# Patient Record
Sex: Male | Born: 1970 | Race: White | Hispanic: No | Marital: Married | Smoking: Never smoker
Health system: Southern US, Community
[De-identification: ages and names within clinical notes are randomized; demographics above are authoritative.]

## PROBLEM LIST (undated history)

## (undated) DIAGNOSIS — E119 Type 2 diabetes mellitus without complications: Secondary | ICD-10-CM

## (undated) DIAGNOSIS — I10 Essential (primary) hypertension: Secondary | ICD-10-CM

## (undated) DIAGNOSIS — J45909 Unspecified asthma, uncomplicated: Secondary | ICD-10-CM

## (undated) DIAGNOSIS — M109 Gout, unspecified: Secondary | ICD-10-CM

## (undated) DIAGNOSIS — E66813 Obesity, class 3: Secondary | ICD-10-CM

## (undated) DIAGNOSIS — M1990 Unspecified osteoarthritis, unspecified site: Secondary | ICD-10-CM

## (undated) DIAGNOSIS — K219 Gastro-esophageal reflux disease without esophagitis: Secondary | ICD-10-CM

## (undated) DIAGNOSIS — E039 Hypothyroidism, unspecified: Secondary | ICD-10-CM

## (undated) DIAGNOSIS — G4730 Sleep apnea, unspecified: Secondary | ICD-10-CM

## (undated) DIAGNOSIS — E781 Pure hyperglyceridemia: Secondary | ICD-10-CM

## (undated) DIAGNOSIS — T753XXA Motion sickness, initial encounter: Secondary | ICD-10-CM

## (undated) HISTORY — PX: SINUS EXPLORATION: SHX5214

---

## 2000-03-17 HISTORY — PX: FRACTURE SURGERY: SHX138

## 2004-10-26 ENCOUNTER — Emergency Department: Payer: Self-pay | Admitting: Unknown Physician Specialty

## 2004-11-02 ENCOUNTER — Other Ambulatory Visit: Payer: Self-pay

## 2004-11-02 ENCOUNTER — Emergency Department: Payer: Self-pay | Admitting: Emergency Medicine

## 2005-02-07 ENCOUNTER — Emergency Department: Payer: Self-pay | Admitting: Emergency Medicine

## 2005-02-13 ENCOUNTER — Emergency Department: Payer: Self-pay | Admitting: Unknown Physician Specialty

## 2006-07-07 ENCOUNTER — Ambulatory Visit: Payer: Self-pay

## 2006-12-10 ENCOUNTER — Ambulatory Visit: Payer: Self-pay | Admitting: Internal Medicine

## 2009-11-22 ENCOUNTER — Ambulatory Visit: Payer: Self-pay | Admitting: General Practice

## 2010-03-17 HISTORY — PX: UMBILICAL HERNIA REPAIR: SHX196

## 2010-08-07 ENCOUNTER — Ambulatory Visit: Payer: Self-pay | Admitting: Surgery

## 2011-07-18 ENCOUNTER — Ambulatory Visit: Payer: Self-pay | Admitting: Internal Medicine

## 2011-08-29 ENCOUNTER — Ambulatory Visit: Payer: Self-pay | Admitting: General Practice

## 2011-08-29 LAB — CBC WITH DIFFERENTIAL/PLATELET
Basophil %: 0.5 %
HCT: 44 % (ref 40.0–52.0)
HGB: 14.8 g/dL (ref 13.0–18.0)
Lymphocyte #: 3 10*3/uL (ref 1.0–3.6)
Lymphocyte %: 31.1 %
MCH: 27.8 pg (ref 26.0–34.0)
Monocyte #: 0.7 x10 3/mm (ref 0.2–1.0)
Monocyte %: 7.4 %
Neutrophil %: 57.1 %
Platelet: 309 10*3/uL (ref 150–440)
RBC: 5.31 10*6/uL (ref 4.40–5.90)
WBC: 9.7 10*3/uL (ref 3.8–10.6)

## 2011-08-29 LAB — COMPREHENSIVE METABOLIC PANEL
Albumin: 3.8 g/dL (ref 3.4–5.0)
Alkaline Phosphatase: 116 U/L (ref 50–136)
Anion Gap: 7 (ref 7–16)
BUN: 16 mg/dL (ref 7–18)
Bilirubin,Total: 0.6 mg/dL (ref 0.2–1.0)
Calcium, Total: 9.2 mg/dL (ref 8.5–10.1)
EGFR (African American): >60
Glucose: 94 mg/dL (ref 65–99)
Osmolality: 280 (ref 275–301)
Potassium: 4.1 mmol/L (ref 3.5–5.1)
SGOT(AST): 42 U/L — ABNORMAL HIGH (ref 15–37)
SGPT (ALT): 63 U/L
Total Protein: 7.6 g/dL (ref 6.4–8.2)

## 2011-08-29 LAB — LIPASE, BLOOD: Lipase: 136 U/L (ref 73–393)

## 2012-11-26 ENCOUNTER — Ambulatory Visit: Payer: Self-pay | Admitting: General Practice

## 2013-11-18 DIAGNOSIS — E039 Hypothyroidism, unspecified: Secondary | ICD-10-CM | POA: Insufficient documentation

## 2013-11-18 DIAGNOSIS — E781 Pure hyperglyceridemia: Secondary | ICD-10-CM | POA: Insufficient documentation

## 2013-11-18 DIAGNOSIS — R7989 Other specified abnormal findings of blood chemistry: Secondary | ICD-10-CM | POA: Insufficient documentation

## 2013-11-22 DIAGNOSIS — M109 Gout, unspecified: Secondary | ICD-10-CM | POA: Insufficient documentation

## 2014-03-07 ENCOUNTER — Ambulatory Visit: Payer: Self-pay | Admitting: Unknown Physician Specialty

## 2014-03-12 LAB — WOUND CULTURE

## 2014-03-14 LAB — WOUND CULTURE

## 2014-03-28 LAB — CULTURE, FUNGUS WITHOUT SMEAR

## 2014-05-19 ENCOUNTER — Ambulatory Visit: Payer: Self-pay | Admitting: Neurology

## 2014-05-19 ENCOUNTER — Ambulatory Visit: Payer: Self-pay | Admitting: Unknown Physician Specialty

## 2014-07-10 LAB — SURGICAL PATHOLOGY

## 2014-07-12 NOTE — Op Note (Signed)
PATIENT NAME:  Robert Skinner, Robert Skinner MR#:  [id number] DATE OF BIRTH:  March 23, 1970  DATE OF PROCEDURE:  03/07/2014   PREOPERATIVE DIAGNOSIS: Chronic right maxillary sinusitis.   POSTOPERATIVE DIAGNOSIS: Chronic right maxillary sinusitis.     SURGEON:  Roena Malady, MD  PROCEDURES PERFORMED:  1.  Stryker navigation system.  2.  Right endoscopic maxillary antrostomy with removal of tissue.    OPERATIVE FINDINGS: Thick sclerotic bone surrounding the sinus, pus emanating from the ostiomeatal unit, thickened hard crust within the sinus and polypoid mucosa.   DESCRIPTION OF PROCEDURE: [name] was identified in the room; he was taken to the Operating Room and placed in the supine position. After general endotracheal anesthesia, the table was turned 90 degrees.  Stryker navigation unit was applied and calibrated and used throughout the procedure. The patient was then draped in the usual fashion for endoscopic sinus surgery. A cottonoid pledget with phenylephrine lidocaine were placed at the right nostril.  At approximately 5 minutes these were removed. A local anesthetic of 1% lidocaine 1:100,000 epinephrine was used to inject the lateral nasal wall and the middle turbinate.  A 0 degree endoscope was introduced into the nose. There was significant lateralization of the middle turbinate. Multiple attempts were tried using the Cottle elevator pushed this medially, but were unsuccessful.  Therefore, submucous resection was performed on the anterior tip of the middle turbinate using the cutting forceps and the straight ethmoid forceps.  This gave excellent visualization to the ostiomeatal area. Using the Stryker navigator, the uncinate process was identified. Cottle elevator was used to take this down. There was pus emanating from the sinus which was sent for a suction trap culture as the uncinate process was taken down, the straight and side-biting forceps were used to open the antrostomy widely. A curved suction was  placed into the sinus.  There was thick, hard, crusty mucopurulent debris which was suctioned out.  This again was sent for culture on a regular culture swab.  Using a side-biting forceps, the maxillary sinus was opened widely and suctioned free of all debris. There was thickened mucosal layer within the sinus which was removed using straight and side-biting forceps. With this completed, the suction cautery was then used to cauterize the anterior tip of the middle turbinate to prevent bleeding.  Stammberger gel was then used to fill the maxillary sinus and the ostiomeatal region. The patient was then returned to anesthesia where he was awakened in the operating room and taken to recovery room in stable condition.   CULTURES: Right maxillary sinus.   SPECIMEN: Right maxillary sinus   ESTIMATED BLOOD LOSS: Less than 20 mL.      ____________________________ Roena Malady, MD ctm:DT D: 03/07/2014 08:38:00 ET T: 03/07/2014 11:58:47 ET JOB#: [id number]  cc: Roena Malady, MD, <Dictator> Roena Malady MD ELECTRONICALLY SIGNED 03/31/2014 8:00

## 2014-12-13 ENCOUNTER — Ambulatory Visit: Payer: PRIVATE HEALTH INSURANCE | Attending: Neurology

## 2014-12-13 DIAGNOSIS — G4733 Obstructive sleep apnea (adult) (pediatric): Secondary | ICD-10-CM | POA: Insufficient documentation

## 2014-12-15 ENCOUNTER — Other Ambulatory Visit: Payer: Self-pay

## 2014-12-15 ENCOUNTER — Emergency Department: Payer: PRIVATE HEALTH INSURANCE

## 2014-12-15 ENCOUNTER — Emergency Department
Admission: EM | Admit: 2014-12-15 | Discharge: 2014-12-15 | Disposition: A | Discharge status: Home / Self Care | Payer: PRIVATE HEALTH INSURANCE | Attending: Emergency Medicine | Admitting: Emergency Medicine

## 2014-12-15 ENCOUNTER — Encounter: Payer: Self-pay | Admitting: Emergency Medicine

## 2014-12-15 DIAGNOSIS — R079 Chest pain, unspecified: Secondary | ICD-10-CM | POA: Insufficient documentation

## 2014-12-15 DIAGNOSIS — R200 Anesthesia of skin: Secondary | ICD-10-CM | POA: Diagnosis not present

## 2014-12-15 DIAGNOSIS — R0602 Shortness of breath: Secondary | ICD-10-CM | POA: Diagnosis present

## 2014-12-15 DIAGNOSIS — Z880 Allergy status to penicillin: Secondary | ICD-10-CM | POA: Diagnosis not present

## 2014-12-15 LAB — BASIC METABOLIC PANEL
ANION GAP: 7 (ref 5–15)
BUN: 11 mg/dL (ref 6–20)
CHLORIDE: 106 mmol/L (ref 101–111)
CO2: 25 mmol/L (ref 22–32)
CREATININE: 1.27 mg/dL — AB (ref 0.61–1.24)
Calcium: 10 mg/dL (ref 8.9–10.3)
GFR calc non Af Amer: >60 mL/min (ref >60)
Glucose, Bld: 115 mg/dL — ABNORMAL HIGH (ref 65–99)
Potassium: 3.8 mmol/L (ref 3.5–5.1)
Sodium: 138 mmol/L (ref 135–145)

## 2014-12-15 LAB — CBC
HEMATOCRIT: 52.8 % — AB (ref 40.0–52.0)
Hemoglobin: 17.8 g/dL (ref 13.0–18.0)
MCH: 27.4 pg (ref 26.0–34.0)
MCHC: 33.7 g/dL (ref 32.0–36.0)
MCV: 81.3 fL (ref 80.0–100.0)
Platelets: 370 10*3/uL (ref 150–440)
RBC: 6.49 MIL/uL — AB (ref 4.40–5.90)
RDW: 14.6 % — ABNORMAL HIGH (ref 11.5–14.5)
WBC: 13.5 10*3/uL — AB (ref 3.8–10.6)

## 2014-12-15 LAB — FIBRIN DERIVATIVES D-DIMER (ARMC ONLY): Fibrin derivatives D-dimer (ARMC): 371 (ref 0–499)

## 2014-12-15 LAB — TROPONIN I: Troponin I: <0.03 ng/mL (ref <0.031)

## 2014-12-15 NOTE — ED Notes (Signed)
Sob becomes worse with exertion

## 2014-12-15 NOTE — Discharge Instructions (Signed)

## 2014-12-15 NOTE — ED Notes (Signed)

## 2014-12-15 NOTE — ED Notes (Signed)
Brought from [facility] with 2 week hx of SOB and chest pressure. Also has had intermittent leg numbness and headaches

## 2014-12-15 NOTE — ED Provider Notes (Signed)
[facility] Emergency Department Provider Note  ____________________________________________  Time seen: Approximately 615 PM  I have reviewed the triage vital signs and the nursing notes.   HISTORY  Chief Complaint Shortness of Breath    HPI Robert Skinner is a 44 y.o. male with a history of hypertension who is presenting today with 2 weeks of shortness of breath and chest pressure. He says that it feels like his gastric reflux. However, he is already on pantoprazole. He says that he has a fullness to the central lower portion of his chest which worsens in the evening when he is going to lie back to go to sleep. He says that he also has diaphoresis at this time. Associated with difficulty breathing. He denies any pain at this time. However, does say that he feels this "fullness" when he takes a deep breath. The patient does take testosterone supplementation. He is currently under the care of multiple doctors including a neurologist who he is seeing for intermittent lower extremity numbness. Also being evaluated for sleep apnea at this time.   History reviewed. No pertinent past medical history.  There are no active problems to display for this patient.   History reviewed. No pertinent past surgical history.  No current outpatient prescriptions on file.  Allergies Penicillins  No family history on file.  Social History Social History  Substance Use Topics  . Smoking status: Never Smoker   . Smokeless tobacco: None  . Alcohol Use: No    Review of Systems Constitutional: No fever/chills Eyes: No visual changes. ENT: No sore throat. Cardiovascular: As above  Respiratory: As above  Gastrointestinal: No abdominal pain.  No nausea, no vomiting.  No diarrhea.  No constipation. Genitourinary: Negative for dysuria. Musculoskeletal: Negative for back pain. Skin: Negative for rash. Neurological: Negative for headaches, focal weakness or  numbness.  10-point ROS otherwise negative.  ____________________________________________   PHYSICAL EXAM:  VITAL SIGNS: ED Triage Vitals  Enc Vitals Group     BP 12/15/14 1611 144/94 mmHg     Pulse Rate 12/15/14 1611 101     Resp 12/15/14 1611 24     Temp 12/15/14 1611 97.9 F (36.6 C)     Temp Source 12/15/14 1611 Oral     SpO2 12/15/14 1611 95 %     Weight 12/15/14 1611 300 lb (136.079 kg)     Height 12/15/14 1611 6\' 1"  (1.854 m)     Head Cir --      Peak Flow --      Pain Score --      Pain Loc --      Pain Edu? --      Excl. in [place]? --     Constitutional: Alert and oriented. Well appearing and in no acute distress. Eyes: Conjunctivae are normal. PERRL. EOMI. Head: Atraumatic. Nose: No congestion/rhinnorhea. Mouth/Throat: Mucous membranes are moist.  Oropharynx non-erythematous. Neck: No stridor.   Cardiovascular: Normal rate, regular rhythm. Grossly normal heart sounds.  Good peripheral circulation. Pain not reproducible on palpation over the chest. Respiratory: Normal respiratory effort.  No retractions. Lungs CTAB. Gastrointestinal: Soft with mild left upper quadrant tenderness to palpation. No right upper quadrant tenderness palpation. Negative Murphy sign.. No distention. No abdominal bruits. No CVA tenderness. Musculoskeletal: No lower extremity tenderness nor edema.  No joint effusions. Neurologic:  Normal speech and language. No gross focal neurologic deficits are appreciated. No gait instability. Skin:  Skin is warm, dry and intact. No rash noted. Psychiatric: Mood  and affect are normal. Speech and behavior are normal.  ____________________________________________   LABS (all labs ordered are listed, but only abnormal results are displayed)  Labs Reviewed  BASIC METABOLIC PANEL - Abnormal; Notable for the following:    Glucose, Bld 115 (*)    Creatinine, Ser 1.27 (*)    All other components within normal limits  CBC - Abnormal; Notable for the  following:    WBC 13.5 (*)    RBC 6.49 (*)    HCT 52.8 (*)    RDW 14.6 (*)    All other components within normal limits  TROPONIN I  FIBRIN DERIVATIVES D-DIMER ([facility] ONLY)   ____________________________________________  EKG  ED ECG REPORT I, Doran Stabler, the attending physician, personally viewed and interpreted this ECG.   Date: 12/15/2014  EKG Time: 1613  Rate: 98  Rhythm: normal sinus rhythm  Axis: Normal axis  Intervals:Incomplete right bundle-branch block  ST&T Change: No ST segment elevation or depression. No abnormal T-wave inversion.  ____________________________________________  RADIOLOGY  No acute cardiopulmonary process. I personally reviewed these films. ____________________________________________   PROCEDURES    ____________________________________________   INITIAL IMPRESSION / ASSESSMENT AND PLAN / ED COURSE  Pertinent labs & imaging results that were available during my care of the patient were reviewed by me and considered in my medical decision making (see chart for details).  ----------------------------------------- 7:27 PM on 12/15/2014 -----------------------------------------  Patient with normal d-dimer. 2 weeks of chest pain without significant ischemia findings on EKG and negative troponin. Unclear cause of the chest pain. However, reassuring cardiac as well as pulmonary embolus workup. Discussed case with Dr. Sophronia Simas who will see the patient is a follow-up in the office. I for the information for this patient via epic inbox to Dr. Sophronia Simas who will follow-up for scheduling with the patient. I discussed the lab results as well as the imaging results with the patient and his family. They're aware that he has had reassuring results here in the emergency department. We also discussed trying Maalox at home in the evenings and he developed these symptoms when laying back. He says that yesterday he had fewer symptoms are normal because he had a  sleep study 2 nights ago where he wore CPAP. He believes that some of the symptoms may be attributed to untreated sleep apnea which I agree with. However, this doesn't completely explain the chest pain. It may explain some shortness of breath though. ____________________________________________   FINAL CLINICAL IMPRESSION(S) / ED DIAGNOSES  Acute chest pain. Initial visit.    Orbie Pyo, MD 12/15/14 [phone]

## 2014-12-22 ENCOUNTER — Ambulatory Visit: Payer: PRIVATE HEALTH INSURANCE | Admitting: Physician Assistant

## 2014-12-22 ENCOUNTER — Ambulatory Visit: Payer: Self-pay

## 2014-12-22 DIAGNOSIS — R7989 Other specified abnormal findings of blood chemistry: Secondary | ICD-10-CM

## 2014-12-22 MED ORDER — TESTOSTERONE CYPIONATE 200 MG/ML IM SOLN: 200.0000 mg | INTRAMUSCULAR | Status: DC

## 2014-12-22 MED ORDER — TESTOSTERONE CYPIONATE 200 MG/ML IM SOLN
200.0000 mg | Freq: Once | INTRAMUSCULAR | Status: AC
  Administered 2014-12-22: 200 mg via INTRAMUSCULAR

## 2014-12-22 NOTE — Progress Notes (Signed)
Pt here for testosterone injection

## 2015-01-05 ENCOUNTER — Other Ambulatory Visit: Payer: Self-pay | Admitting: Family Medicine

## 2015-01-05 ENCOUNTER — Ambulatory Visit: Payer: Self-pay

## 2015-01-05 DIAGNOSIS — R7989 Other specified abnormal findings of blood chemistry: Secondary | ICD-10-CM

## 2015-01-05 MED ORDER — TESTOSTERONE CYPIONATE 200 MG/ML IM SOLN
200.0000 mg | Freq: Once | INTRAMUSCULAR | Status: AC
  Administered 2015-01-05: 200 mg via INTRAMUSCULAR

## 2015-01-09 LAB — CBC WITH DIFFERENTIAL/PLATELET
Basophils Absolute: 0 10*3/uL (ref 0.0–0.2)
Basos: 0 %
EOS (ABSOLUTE): 0.2 10*3/uL (ref 0.0–0.4)
EOS: 3 %
HEMATOCRIT: 48.1 % (ref 37.5–51.0)
HEMOGLOBIN: 16.5 g/dL (ref 12.6–17.7)
IMMATURE GRANS (ABS): 0 10*3/uL (ref 0.0–0.1)
Immature Granulocytes: 0 %
LYMPHS ABS: 2.7 10*3/uL (ref 0.7–3.1)
Lymphs: 31 %
MCH: 28.4 pg (ref 26.6–33.0)
MCHC: 34.3 g/dL (ref 31.5–35.7)
MCV: 83 fL (ref 79–97)
MONOCYTES: 9 %
Monocytes Absolute: 0.8 10*3/uL (ref 0.1–0.9)
NEUTROS ABS: 5 10*3/uL (ref 1.4–7.0)
Neutrophils: 57 %
Platelets: 290 10*3/uL (ref 150–379)
RBC: 5.8 x10E6/uL (ref 4.14–5.80)
RDW: 14.6 % (ref 12.3–15.4)
WBC: 8.7 10*3/uL (ref 3.4–10.8)

## 2015-01-09 LAB — TESTOSTERONE: Testosterone: 291 ng/dL — ABNORMAL LOW (ref 348–1197)

## 2015-01-09 LAB — B12 AND FOLATE PANEL
FOLATE: 4.7 ng/mL (ref >3.0)
Vitamin B-12: 316 pg/mL (ref 211–946)

## 2015-01-09 LAB — SPECIMEN STATUS REPORT

## 2015-01-09 LAB — PSA: Prostate Specific Ag, Serum: 1.2 ng/mL (ref 0.0–4.0)

## 2015-01-17 ENCOUNTER — Other Ambulatory Visit: Payer: Self-pay | Admitting: Emergency Medicine

## 2015-01-17 MED ORDER — LOSARTAN POTASSIUM 50 MG PO TABS: 50.0000 mg | ORAL_TABLET | Freq: Every day | ORAL | Status: DC

## 2015-01-17 MED ORDER — GEMFIBROZIL 600 MG PO TABS: 600.0000 mg | ORAL_TABLET | Freq: Every morning | ORAL | Status: DC

## 2015-01-17 NOTE — Telephone Encounter (Signed)
Received faxed medication request from [facility].  Please advise.  Thank you 

## 2015-01-17 NOTE — Telephone Encounter (Signed)
Ernestine will have to come in for refill on AMbien due to controlled substance, sent in refills x 3 for gemfibrizol and losartan, want to recheck labs in 2 months for fasting lipids

## 2015-01-19 ENCOUNTER — Ambulatory Visit: Payer: Self-pay | Admitting: Physician Assistant

## 2015-01-19 ENCOUNTER — Encounter: Payer: Self-pay | Admitting: Physician Assistant

## 2015-01-19 VITALS — BP 120/80 | HR 85 | Temp 98.7°F

## 2015-01-19 DIAGNOSIS — G4700 Insomnia, unspecified: Secondary | ICD-10-CM

## 2015-01-19 DIAGNOSIS — R7989 Other specified abnormal findings of blood chemistry: Secondary | ICD-10-CM

## 2015-01-19 MED ORDER — ZOLPIDEM TARTRATE 10 MG PO TABS: 10.0000 mg | ORAL_TABLET | Freq: Every day | ORAL | Status: DC

## 2015-01-19 MED ORDER — TESTOSTERONE CYPIONATE 200 MG/ML IM SOLN
200.0000 mg | INTRAMUSCULAR | Status: DC
  Administered 2015-01-19 – 2015-04-02 (×6): 200 mg via INTRAMUSCULAR

## 2015-01-19 NOTE — Progress Notes (Signed)
S: C/o runny nose and congestion for 3 days, no fever, chills, cp/sob, v/d; mucus is green and thick at night right before bed, clear throughout the day, has been wearing cpap machine and eyes are drying out from forced air, also ?what lab results were from previous lab draw, states he sweats a lot when on synthroid 11mcg  Using otc meds:   O: PE: perrl eomi, normocephalic, tms dull, nasal mucosa red and swollen on r side, throat injected, neck supple no lymph, lungs c t a, cv rrr, neuro intact  A:  chronic sinusitis   P: use otc meds, pt recently on antibiotic and don't feel he needs another as mucus is only dark at night and 1st thing in the morning drink fluids, continue regular meds , use otc meds of choice, return if not improving in 5 days, return earlier if worsening ; also told him to f/u with endocrinology as she is managing his thyroid and testosterone problems

## 2015-01-19 NOTE — Addendum Note (Signed)
Addended by: Versie Starks on: 01/19/2015 12:45 PM   Modules accepted: Orders

## 2015-01-19 NOTE — Progress Notes (Signed)
Also gave refill on ambien 10mg  #30 3 refills

## 2015-01-31 ENCOUNTER — Ambulatory Visit: Payer: PRIVATE HEALTH INSURANCE | Admitting: Cardiovascular Disease

## 2015-02-02 ENCOUNTER — Ambulatory Visit: Payer: Self-pay | Admitting: Physician Assistant

## 2015-02-02 DIAGNOSIS — R7989 Other specified abnormal findings of blood chemistry: Secondary | ICD-10-CM

## 2015-02-02 NOTE — Progress Notes (Signed)
Patient ID: Robert Skinner, male   DOB: 1970/09/05, 44 y.o.   MRN: [id number] Patient came in for his scheduled Testosterone injection.  Injection was given in the right glut without any incident.

## 2015-02-16 ENCOUNTER — Ambulatory Visit: Payer: Self-pay | Admitting: Physician Assistant

## 2015-02-16 DIAGNOSIS — R7989 Other specified abnormal findings of blood chemistry: Secondary | ICD-10-CM

## 2015-02-16 NOTE — Progress Notes (Signed)
Patient ID: Robert Skinner, male   DOB: May 21, 1970, 44 y.o.   MRN: [id number] Patient came in to have his testosterone injection.  Injection was given in his left buttock and patient tolerated it well.

## 2015-02-28 ENCOUNTER — Other Ambulatory Visit: Payer: Self-pay | Admitting: Physician Assistant

## 2015-02-28 DIAGNOSIS — R7989 Other specified abnormal findings of blood chemistry: Secondary | ICD-10-CM

## 2015-02-28 DIAGNOSIS — Z299 Encounter for prophylactic measures, unspecified: Secondary | ICD-10-CM

## 2015-02-28 NOTE — Progress Notes (Signed)
Patient came in to have blood drawn per Dr. Sammuel Hines orders and to received his scheduled Testosterone injection. Blood was drawn from the patient's right arm without any incident. Pt wants results sent to Dr. Eddie Dibbles when they are finalized.

## 2015-03-01 LAB — CMP12+LP+TP+TSH+6AC+CBC/D/PLT
ALBUMIN: 4.4 g/dL (ref 3.5–5.5)
ALK PHOS: 95 IU/L (ref 39–117)
ALT: 46 IU/L — AB (ref 0–44)
AST: 53 IU/L — AB (ref 0–40)
Albumin/Globulin Ratio: 1.6 (ref 1.1–2.5)
BASOS: 0 %
BUN/Creatinine Ratio: 10 (ref 9–20)
BUN: 12 mg/dL (ref 6–24)
Basophils Absolute: 0 10*3/uL (ref 0.0–0.2)
Bilirubin Total: 0.4 mg/dL (ref 0.0–1.2)
CALCIUM: 10 mg/dL (ref 8.7–10.2)
CHLORIDE: 100 mmol/L (ref 96–106)
CHOLESTEROL TOTAL: 202 mg/dL — AB (ref 100–199)
Chol/HDL Ratio: 7.8 ratio units — ABNORMAL HIGH (ref 0.0–5.0)
Creatinine, Ser: 1.21 mg/dL (ref 0.76–1.27)
EOS (ABSOLUTE): 0.3 10*3/uL (ref 0.0–0.4)
ESTIMATED CHD RISK: 1.6 times avg. — AB (ref 0.0–1.0)
Eos: 3 %
FREE THYROXINE INDEX: 2.5 (ref 1.2–4.9)
GFR calc Af Amer: 84 mL/min/{1.73_m2} (ref >59)
GFR calc non Af Amer: 72 mL/min/{1.73_m2} (ref >59)
GGT: 52 IU/L (ref 0–65)
GLOBULIN, TOTAL: 2.7 g/dL (ref 1.5–4.5)
Glucose: 86 mg/dL (ref 65–99)
HDL: 26 mg/dL — ABNORMAL LOW (ref >39)
HEMATOCRIT: 48.4 % (ref 37.5–51.0)
Hemoglobin: 16.4 g/dL (ref 12.6–17.7)
Immature Grans (Abs): 0 10*3/uL (ref 0.0–0.1)
Immature Granulocytes: 0 %
Iron: 63 ug/dL (ref 38–169)
LDH: 245 IU/L — ABNORMAL HIGH (ref 121–224)
LDL Calculated: 137 mg/dL — ABNORMAL HIGH (ref 0–99)
LYMPHS ABS: 3.2 10*3/uL — AB (ref 0.7–3.1)
Lymphs: 34 %
MCH: 27.6 pg (ref 26.6–33.0)
MCHC: 33.9 g/dL (ref 31.5–35.7)
MCV: 81 fL (ref 79–97)
MONOS ABS: 0.8 10*3/uL (ref 0.1–0.9)
Monocytes: 8 %
NEUTROS ABS: 5.1 10*3/uL (ref 1.4–7.0)
NEUTROS PCT: 55 %
PHOSPHORUS: 2.6 mg/dL (ref 2.5–4.5)
POTASSIUM: 4.7 mmol/L (ref 3.5–5.2)
Platelets: 352 10*3/uL (ref 150–379)
RBC: 5.95 x10E6/uL — ABNORMAL HIGH (ref 4.14–5.80)
RDW: 14.6 % (ref 12.3–15.4)
SODIUM: 141 mmol/L (ref 134–144)
T3 Uptake Ratio: 28 % (ref 24–39)
T4 TOTAL: 8.9 ug/dL (ref 4.5–12.0)
TOTAL PROTEIN: 7.1 g/dL (ref 6.0–8.5)
TRIGLYCERIDES: 194 mg/dL — AB (ref 0–149)
TSH: 4.66 u[IU]/mL — AB (ref 0.450–4.500)
Uric Acid: 9 mg/dL — ABNORMAL HIGH (ref 3.7–8.6)
VLDL Cholesterol Cal: 39 mg/dL (ref 5–40)
WBC: 9.4 10*3/uL (ref 3.4–10.8)

## 2015-03-01 LAB — TESTOSTERONE: TESTOSTERONE: 439 ng/dL (ref 348–1197)

## 2015-03-01 LAB — HGB A1C W/O EAG: Hgb A1c MFr Bld: 5.9 % — ABNORMAL HIGH (ref 4.8–5.6)

## 2015-03-01 NOTE — Progress Notes (Signed)
Results were faxed to Dr. Eddie Dibbles per patient's request.

## 2015-03-15 ENCOUNTER — Other Ambulatory Visit: Payer: Self-pay | Admitting: Emergency Medicine

## 2015-03-15 MED ORDER — COLCHICINE 0.6 MG PO TABS: 0.6000 mg | ORAL_TABLET | Freq: Two times a day (BID) | ORAL | Status: DC

## 2015-03-15 MED ORDER — INDOMETHACIN 50 MG PO CAPS: 50.0000 mg | ORAL_CAPSULE | Freq: Three times a day (TID) | ORAL | Status: DC | PRN

## 2015-03-15 NOTE — Telephone Encounter (Signed)
Received a faxed medication request from [facility].  Please advise.  Thank you. 

## 2015-03-15 NOTE — Telephone Encounter (Signed)
Med refill approved, pts recent labs were normal for kidney function and had elevated uric acid

## 2015-03-16 ENCOUNTER — Ambulatory Visit: Payer: Self-pay | Admitting: Physician Assistant

## 2015-03-16 DIAGNOSIS — Z299 Encounter for prophylactic measures, unspecified: Secondary | ICD-10-CM

## 2015-03-16 NOTE — Progress Notes (Signed)
Patient ID: Robert Skinner, male   DOB: [birth year], 44 y.o.   MRN: [id number] Patient came in to have his bi weekly testosterone injection.

## 2015-04-02 ENCOUNTER — Ambulatory Visit: Payer: Self-pay | Admitting: Physician Assistant

## 2015-04-02 DIAGNOSIS — R7989 Other specified abnormal findings of blood chemistry: Secondary | ICD-10-CM

## 2015-04-02 NOTE — Progress Notes (Signed)
Patient ID: Robert Skinner, male   DOB: 1970/09/13, 45 y.o.   MRN: [id number] Patient came in to get his scheduled testosterone injection.

## 2015-04-12 ENCOUNTER — Other Ambulatory Visit: Payer: Self-pay | Admitting: Physician Assistant

## 2015-04-13 ENCOUNTER — Ambulatory Visit: Payer: Self-pay | Admitting: Physician Assistant

## 2015-04-16 ENCOUNTER — Other Ambulatory Visit: Payer: Self-pay | Admitting: Physician Assistant

## 2015-04-16 MED ORDER — PANTOPRAZOLE SODIUM 40 MG PO TBEC: DELAYED_RELEASE_TABLET | ORAL | Status: DC

## 2015-04-16 NOTE — Progress Notes (Signed)
Pt takes med bid , insurance not approving, Wrote rx as 1 or 2 pills a day

## 2015-05-16 ENCOUNTER — Other Ambulatory Visit: Payer: Self-pay | Admitting: Physician Assistant

## 2015-05-18 ENCOUNTER — Other Ambulatory Visit: Payer: Self-pay | Admitting: Physician Assistant

## 2015-05-21 NOTE — Telephone Encounter (Signed)
Repeat thyroid labs needed.

## 2015-06-15 ENCOUNTER — Other Ambulatory Visit: Payer: Self-pay | Admitting: Physician Assistant

## 2015-06-15 NOTE — Telephone Encounter (Signed)
Med refill approved, ?if he should be getting this from the endocrinologist

## 2015-08-24 ENCOUNTER — Encounter: Payer: Self-pay | Admitting: Physician Assistant

## 2015-08-24 ENCOUNTER — Ambulatory Visit: Payer: Self-pay | Admitting: Physician Assistant

## 2015-08-24 VITALS — BP 120/80 | HR 84 | Temp 98.3°F

## 2015-08-24 DIAGNOSIS — W57XXXA Bitten or stung by nonvenomous insect and other nonvenomous arthropods, initial encounter: Secondary | ICD-10-CM

## 2015-08-24 MED ORDER — NAPROXEN 500 MG PO TABS: 500.0000 mg | ORAL_TABLET | Freq: Two times a day (BID) | ORAL | Status: DC

## 2015-08-24 MED ORDER — ONDANSETRON HCL 8 MG PO TABS: 8.0000 mg | ORAL_TABLET | Freq: Three times a day (TID) | ORAL | Status: DC | PRN

## 2015-08-24 MED ORDER — SULFAMETHOXAZOLE-TRIMETHOPRIM 800-160 MG PO TABS: 1.0000 | ORAL_TABLET | Freq: Two times a day (BID) | ORAL | Status: DC

## 2015-08-24 NOTE — Progress Notes (Signed)
   Subjective:Insect bite    Patient ID: Robert Skinner, male    DOB: Mar 02, 1971, 45 y.o.   MRN: [id number]  HPI Patient bitten by suspect Spider 2 days ago. Yesterday developed nodular lesion right lateral mandible area. Patient states yellow/greenish discharge today. States no pain, fever or chills. Patient states nausea and vomiting yesterday. States only nausea today.   Review of Systems    Hyperlipidema and hypothyroidism Objective:   Physical Exam No acute distress. Nodular lesion right lateral mandible with erythematous base. No acute distress. No adenopathy.        Assessment & Plan:Facial cellulitis 2nd to insect bite.  Take Bactrim DS, Naproxen, and Zofran as directed.  Follow up 3 days if no improvement.

## 2015-09-13 ENCOUNTER — Other Ambulatory Visit: Payer: Self-pay | Admitting: Physician Assistant

## 2015-10-13 ENCOUNTER — Other Ambulatory Visit: Payer: Self-pay | Admitting: Physician Assistant

## 2015-11-12 ENCOUNTER — Other Ambulatory Visit: Payer: Self-pay | Admitting: Family

## 2015-11-12 ENCOUNTER — Other Ambulatory Visit: Payer: Self-pay | Admitting: Physician Assistant

## 2015-11-12 NOTE — Telephone Encounter (Signed)
Med refill requests, pt should get his refills from his pcp and endocrinologist

## 2015-11-14 ENCOUNTER — Other Ambulatory Visit: Payer: Self-pay | Admitting: Physician Assistant

## 2015-11-21 ENCOUNTER — Other Ambulatory Visit: Payer: Self-pay | Admitting: Physician Assistant

## 2015-12-10 ENCOUNTER — Other Ambulatory Visit: Payer: Self-pay | Admitting: Emergency Medicine

## 2015-12-11 MED ORDER — GEMFIBROZIL 600 MG PO TABS: 600.0000 mg | ORAL_TABLET | Freq: Every morning | ORAL | 3 refills | Status: DC

## 2015-12-11 MED ORDER — LOSARTAN POTASSIUM 50 MG PO TABS: 50.0000 mg | ORAL_TABLET | Freq: Every day | ORAL | 3 refills | Status: DC

## 2015-12-11 MED ORDER — LEVOTHYROXINE SODIUM 125 MCG PO TABS: ORAL_TABLET | ORAL | 0 refills | Status: DC

## 2015-12-11 NOTE — Telephone Encounter (Signed)
Med refill approved as pt still has not seen his endocrinologist and is out of medication, once again told him he needed to see her and have her refill these meds that this is not good practice and not the right thing for his health, states he understands

## 2016-01-09 ENCOUNTER — Other Ambulatory Visit: Payer: Self-pay | Admitting: Physician Assistant

## 2016-02-04 ENCOUNTER — Other Ambulatory Visit: Payer: Self-pay | Admitting: Physician Assistant

## 2016-04-14 ENCOUNTER — Encounter: Payer: Self-pay | Admitting: Physician Assistant

## 2016-04-14 ENCOUNTER — Ambulatory Visit: Payer: Self-pay | Admitting: Physician Assistant

## 2016-04-14 VITALS — BP 130/100 | Temp 98.3°F | Ht 74.0 in | Wt 304.0 lb

## 2016-04-14 DIAGNOSIS — Z299 Encounter for prophylactic measures, unspecified: Secondary | ICD-10-CM

## 2016-04-14 MED ORDER — LOSARTAN POTASSIUM 50 MG PO TABS: 50.0000 mg | ORAL_TABLET | Freq: Every day | ORAL | 6 refills | Status: DC

## 2016-04-14 MED ORDER — GEMFIBROZIL 600 MG PO TABS: 600.0000 mg | ORAL_TABLET | Freq: Every morning | ORAL | 6 refills | Status: DC

## 2016-04-14 NOTE — Progress Notes (Signed)
S: here for med refill, would also like to have his biometric form filled out, has lab order from dr Eddie Dibbles, no problems, has been out of bp med for last 2 days  O: vitals w slightly elevated bp, lungs c t a, cv rrr, n/v intact  A: med refill for htn, hypertriglyceridemia  P: biometric form signed, labs today, med refills, f/u with pcp for full physical

## 2016-04-15 LAB — CMP12+LP+TP+TSH+6AC+PSA+CBC…
A/G RATIO: 1.4 (ref 1.2–2.2)
ALBUMIN: 4.5 g/dL (ref 3.5–5.5)
ALK PHOS: 127 IU/L — AB (ref 39–117)
ALT: 45 IU/L — ABNORMAL HIGH (ref 0–44)
AST: 44 IU/L — AB (ref 0–40)
BILIRUBIN TOTAL: 0.3 mg/dL (ref 0.0–1.2)
BUN / CREAT RATIO: 15 (ref 9–20)
BUN: 16 mg/dL (ref 6–24)
Basophils Absolute: 0 10*3/uL (ref 0.0–0.2)
Basos: 0 %
CHLORIDE: 102 mmol/L (ref 96–106)
CHOLESTEROL TOTAL: 204 mg/dL — AB (ref 100–199)
Calcium: 9.7 mg/dL (ref 8.7–10.2)
Chol/HDL Ratio: 6.4 ratio units — ABNORMAL HIGH (ref 0.0–5.0)
Creatinine, Ser: 1.04 mg/dL (ref 0.76–1.27)
EOS (ABSOLUTE): 0.2 10*3/uL (ref 0.0–0.4)
EOS: 3 %
ESTIMATED CHD RISK: 1.4 times avg. — AB (ref 0.0–1.0)
FREE THYROXINE INDEX: 2.4 (ref 1.2–4.9)
GFR calc non Af Amer: 86 mL/min/{1.73_m2} (ref >59)
GFR, EST AFRICAN AMERICAN: 100 mL/min/{1.73_m2} (ref >59)
GGT: 75 IU/L — ABNORMAL HIGH (ref 0–65)
Globulin, Total: 3.2 g/dL (ref 1.5–4.5)
Glucose: 110 mg/dL — ABNORMAL HIGH (ref 65–99)
HDL: 32 mg/dL — AB (ref >39)
HEMOGLOBIN: 14.7 g/dL (ref 13.0–17.7)
Hematocrit: 44.6 % (ref 37.5–51.0)
IMMATURE GRANULOCYTES: 0 %
IRON: 106 ug/dL (ref 38–169)
Immature Grans (Abs): 0 10*3/uL (ref 0.0–0.1)
LDH: 195 IU/L (ref 121–224)
LDL CALC: 134 mg/dL — AB (ref 0–99)
Lymphocytes Absolute: 2.8 10*3/uL (ref 0.7–3.1)
Lymphs: 35 %
MCH: 27.4 pg (ref 26.6–33.0)
MCHC: 33 g/dL (ref 31.5–35.7)
MCV: 83 fL (ref 79–97)
MONOCYTES: 6 %
Monocytes Absolute: 0.5 10*3/uL (ref 0.1–0.9)
NEUTROS ABS: 4.5 10*3/uL (ref 1.4–7.0)
Neutrophils: 56 %
Phosphorus: 2.7 mg/dL (ref 2.5–4.5)
Platelets: 340 10*3/uL (ref 150–379)
Potassium: 4.6 mmol/L (ref 3.5–5.2)
Prostate Specific Ag, Serum: 1.5 ng/mL (ref 0.0–4.0)
RBC: 5.37 x10E6/uL (ref 4.14–5.80)
RDW: 14.3 % (ref 12.3–15.4)
Sodium: 139 mmol/L (ref 134–144)
T3 UPTAKE RATIO: 26 % (ref 24–39)
T4, Total: 9.2 ug/dL (ref 4.5–12.0)
TOTAL PROTEIN: 7.7 g/dL (ref 6.0–8.5)
TSH: 2.94 u[IU]/mL (ref 0.450–4.500)
Triglycerides: 189 mg/dL — ABNORMAL HIGH (ref 0–149)
Uric Acid: 8.8 mg/dL — ABNORMAL HIGH (ref 3.7–8.6)
VLDL CHOLESTEROL CAL: 38 mg/dL (ref 5–40)
WBC: 8 10*3/uL (ref 3.4–10.8)

## 2016-04-15 LAB — HEMOGLOBIN A1C
ESTIMATED AVERAGE GLUCOSE: 126 mg/dL
Hgb A1c MFr Bld: 6 % — ABNORMAL HIGH (ref 4.8–5.6)

## 2016-04-15 LAB — TESTOSTERONE,FREE AND TOTAL
TESTOSTERONE FREE: 5.9 pg/mL — AB (ref 6.8–21.5)
TESTOSTERONE: 133 ng/dL — AB (ref 264–916)

## 2016-05-07 ENCOUNTER — Other Ambulatory Visit: Payer: Self-pay | Admitting: Physician Assistant

## 2016-05-07 NOTE — Telephone Encounter (Signed)
Med refill for protonix approved 

## 2016-05-08 ENCOUNTER — Ambulatory Visit: Payer: Self-pay | Admitting: Physician Assistant

## 2016-05-08 ENCOUNTER — Encounter: Payer: Self-pay | Admitting: Physician Assistant

## 2016-05-08 VITALS — BP 120/80 | HR 83 | Temp 97.8°F

## 2016-05-08 DIAGNOSIS — J0100 Acute maxillary sinusitis, unspecified: Secondary | ICD-10-CM

## 2016-05-08 MED ORDER — CEFDINIR 300 MG PO CAPS: 300.0000 mg | ORAL_CAPSULE | Freq: Two times a day (BID) | ORAL | 0 refills | Status: DC

## 2016-05-08 NOTE — Progress Notes (Signed)
S: C/o runny nose and congestion for 8 days, no fever, chills, cp/sob, v/d; mucus is green and thick, cough is sporadic, c/o of facial and dental pain.   Using otc meds:   O: PE: vitals wnl, nad, perrl eomi, normocephalic, tms dull, nasal mucosa red and swollen, throat injected, neck supple no lymph, lungs c t a, cv rrr, neuro intact  A:  Acute sinusitis   P: drink fluids, continue regular meds , use otc meds of choice, return if not improving in 5 days, return earlier if worsening , omnicef 300mg  bid x 10d

## 2016-05-20 ENCOUNTER — Encounter: Payer: Self-pay | Admitting: Emergency Medicine

## 2016-05-20 ENCOUNTER — Emergency Department
Admission: EM | Admit: 2016-05-20 | Discharge: 2016-05-20 | Disposition: A | Discharge status: Home / Self Care | Payer: Worker's Compensation | Attending: Emergency Medicine | Admitting: Emergency Medicine

## 2016-05-20 ENCOUNTER — Emergency Department: Payer: Worker's Compensation

## 2016-05-20 DIAGNOSIS — Y9389 Activity, other specified: Secondary | ICD-10-CM | POA: Diagnosis not present

## 2016-05-20 DIAGNOSIS — S161XXA Strain of muscle, fascia and tendon at neck level, initial encounter: Secondary | ICD-10-CM | POA: Insufficient documentation

## 2016-05-20 DIAGNOSIS — Y990 Civilian activity done for income or pay: Secondary | ICD-10-CM | POA: Diagnosis not present

## 2016-05-20 DIAGNOSIS — Y929 Unspecified place or not applicable: Secondary | ICD-10-CM | POA: Insufficient documentation

## 2016-05-20 DIAGNOSIS — W228XXA Striking against or struck by other objects, initial encounter: Secondary | ICD-10-CM | POA: Insufficient documentation

## 2016-05-20 DIAGNOSIS — S0990XA Unspecified injury of head, initial encounter: Secondary | ICD-10-CM | POA: Diagnosis present

## 2016-05-20 MED ORDER — NAPROXEN 500 MG PO TABS: 500.0000 mg | ORAL_TABLET | Freq: Two times a day (BID) | ORAL | 2 refills | Status: DC
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
## 2016-06-06 ENCOUNTER — Other Ambulatory Visit: Payer: Self-pay | Admitting: Emergency Medicine

## 2016-06-06 MED ORDER — FLUTICASONE PROPIONATE 50 MCG/ACT NA SUSP: 2.0000 | Freq: Every day | NASAL | 12 refills | Status: AC

## 2016-06-06 NOTE — Telephone Encounter (Signed)
Med refill for flonase approved

## 2016-06-23 ENCOUNTER — Other Ambulatory Visit: Payer: Self-pay

## 2016-06-23 DIAGNOSIS — G4733 Obstructive sleep apnea (adult) (pediatric): Secondary | ICD-10-CM | POA: Insufficient documentation

## 2016-06-25 ENCOUNTER — Other Ambulatory Visit: Payer: Self-pay

## 2016-06-25 DIAGNOSIS — Z299 Encounter for prophylactic measures, unspecified: Secondary | ICD-10-CM

## 2016-06-25 NOTE — Progress Notes (Signed)
Patient came in to have blood drawn per Dr. Olin Pia orders.

## 2016-06-27 LAB — CMP12+LP+TP+TSH+6AC+PSA+CBC…
ALBUMIN: 4.7 g/dL (ref 3.5–5.5)
ALT: 45 IU/L — AB (ref 0–44)
AST: 68 IU/L — ABNORMAL HIGH (ref 0–40)
Albumin/Globulin Ratio: 1.4 (ref 1.2–2.2)
Alkaline Phosphatase: 130 IU/L — ABNORMAL HIGH (ref 39–117)
BASOS: 0 %
BUN/Creatinine Ratio: 13 (ref 9–20)
BUN: 15 mg/dL (ref 6–24)
Basophils Absolute: 0 10*3/uL (ref 0.0–0.2)
Bilirubin Total: 0.6 mg/dL (ref 0.0–1.2)
CALCIUM: 10 mg/dL (ref 8.7–10.2)
CHOLESTEROL TOTAL: 231 mg/dL — AB (ref 100–199)
Chloride: 102 mmol/L (ref 96–106)
Chol/HDL Ratio: 7.7 ratio — ABNORMAL HIGH (ref 0.0–5.0)
Creatinine, Ser: 1.18 mg/dL (ref 0.76–1.27)
EOS (ABSOLUTE): 0.3 10*3/uL (ref 0.0–0.4)
Eos: 3 %
Estimated CHD Risk: 1.6 times avg. — ABNORMAL HIGH (ref 0.0–1.0)
FREE THYROXINE INDEX: 3 (ref 1.2–4.9)
GFR calc Af Amer: 85 mL/min/{1.73_m2} (ref >59)
GFR calc non Af Amer: 74 mL/min/{1.73_m2} (ref >59)
GGT: 57 IU/L (ref 0–65)
GLOBULIN, TOTAL: 3.4 g/dL (ref 1.5–4.5)
Glucose: 125 mg/dL — ABNORMAL HIGH (ref 65–99)
HDL: 30 mg/dL — ABNORMAL LOW (ref >39)
HEMATOCRIT: 44.1 % (ref 37.5–51.0)
Hemoglobin: 14.5 g/dL (ref 13.0–17.7)
IMMATURE GRANULOCYTES: 0 %
IRON: 138 ug/dL (ref 38–169)
Immature Grans (Abs): 0 10*3/uL (ref 0.0–0.1)
LDH: 396 IU/L — ABNORMAL HIGH (ref 121–224)
LDL Calculated: 166 mg/dL — ABNORMAL HIGH (ref 0–99)
LYMPHS ABS: 3 10*3/uL (ref 0.7–3.1)
Lymphs: 31 %
MCH: 27.2 pg (ref 26.6–33.0)
MCHC: 32.9 g/dL (ref 31.5–35.7)
MCV: 83 fL (ref 79–97)
MONOS ABS: 0.8 10*3/uL (ref 0.1–0.9)
Monocytes: 8 %
NEUTROS PCT: 58 %
Neutrophils Absolute: 5.5 10*3/uL (ref 1.4–7.0)
PHOSPHORUS: 2.9 mg/dL (ref 2.5–4.5)
POTASSIUM: 4.4 mmol/L (ref 3.5–5.2)
PROSTATE SPECIFIC AG, SERUM: 1.6 ng/mL (ref 0.0–4.0)
Platelets: 336 10*3/uL (ref 150–379)
RBC: 5.34 x10E6/uL (ref 4.14–5.80)
RDW: 15.1 % (ref 12.3–15.4)
SODIUM: 146 mmol/L — AB (ref 134–144)
T3 Uptake Ratio: 27 % (ref 24–39)
T4 TOTAL: 11.1 ug/dL (ref 4.5–12.0)
TOTAL PROTEIN: 8.1 g/dL (ref 6.0–8.5)
TRIGLYCERIDES: 177 mg/dL — AB (ref 0–149)
TSH: 4.52 u[IU]/mL — ABNORMAL HIGH (ref 0.450–4.500)
Uric Acid: 11 mg/dL — ABNORMAL HIGH (ref 3.7–8.6)
VLDL Cholesterol Cal: 35 mg/dL (ref 5–40)
WBC: 9.5 10*3/uL (ref 3.4–10.8)

## 2016-06-27 LAB — URINALYSIS, ROUTINE W REFLEX MICROSCOPIC
BILIRUBIN UA: NEGATIVE
GLUCOSE, UA: NEGATIVE
KETONES UA: NEGATIVE
Leukocytes, UA: NEGATIVE
NITRITE UA: NEGATIVE
Protein, UA: NEGATIVE
RBC UA: NEGATIVE
SPEC GRAV UA: 1.022 (ref 1.005–1.030)
UUROB: 0.2 mg/dL (ref 0.2–1.0)
pH, UA: 5 (ref 5.0–7.5)

## 2016-06-27 LAB — HGB A1C W/O EAG: HEMOGLOBIN A1C: 6 % — AB (ref 4.8–5.6)

## 2016-06-27 LAB — MICROALBUMIN / CREATININE URINE RATIO
Creatinine, Urine: 248.7 mg/dL
Microalb/Creat Ratio: 3.2 mg/g creat (ref 0.0–30.0)
Microalbumin, Urine: 8 ug/mL

## 2016-07-10 DIAGNOSIS — R739 Hyperglycemia, unspecified: Secondary | ICD-10-CM | POA: Insufficient documentation

## 2016-07-16 ENCOUNTER — Other Ambulatory Visit: Payer: Self-pay

## 2016-07-16 DIAGNOSIS — Z299 Encounter for prophylactic measures, unspecified: Secondary | ICD-10-CM

## 2016-07-16 NOTE — Progress Notes (Signed)
Patient came in to have blood drawn for testing per dr. Olin Pia orders.

## 2016-07-17 LAB — HEPATIC FUNCTION PANEL
ALK PHOS: 126 IU/L — AB (ref 39–117)
ALT: 52 IU/L — AB (ref 0–44)
AST: 57 IU/L — AB (ref 0–40)
Albumin: 4.7 g/dL (ref 3.5–5.5)
BILIRUBIN TOTAL: 0.3 mg/dL (ref 0.0–1.2)
BILIRUBIN, DIRECT: 0.07 mg/dL (ref 0.00–0.40)
Total Protein: 7.9 g/dL (ref 6.0–8.5)

## 2016-07-17 LAB — URIC ACID: Uric Acid: 9.9 mg/dL — ABNORMAL HIGH (ref 3.7–8.6)

## 2016-07-23 ENCOUNTER — Other Ambulatory Visit: Payer: Self-pay | Admitting: Internal Medicine

## 2016-07-23 DIAGNOSIS — R748 Abnormal levels of other serum enzymes: Secondary | ICD-10-CM

## 2016-07-29 ENCOUNTER — Ambulatory Visit
Admission: RE | Admit: 2016-07-29 | Discharge: 2016-07-29 | Disposition: A | Discharge status: Home / Self Care | Payer: Managed Care, Other (non HMO) | Source: Ambulatory Visit | Attending: Internal Medicine | Admitting: Internal Medicine

## 2016-07-29 DIAGNOSIS — R748 Abnormal levels of other serum enzymes: Secondary | ICD-10-CM | POA: Diagnosis present

## 2016-08-06 ENCOUNTER — Other Ambulatory Visit: Payer: Self-pay | Admitting: Physician Assistant

## 2016-08-07 NOTE — Telephone Encounter (Signed)
Med refill for indocin approved

## 2016-11-12 ENCOUNTER — Other Ambulatory Visit: Payer: Self-pay | Admitting: Physician Assistant

## 2016-11-13 NOTE — Telephone Encounter (Signed)
Med refill, pt states he has seen his doctor regularly, last labs were in April, 2018

## 2016-12-12 ENCOUNTER — Other Ambulatory Visit: Payer: Self-pay | Admitting: Physician Assistant

## 2016-12-23 ENCOUNTER — Other Ambulatory Visit: Payer: Worker's Compensation

## 2016-12-23 DIAGNOSIS — Z299 Encounter for prophylactic measures, unspecified: Secondary | ICD-10-CM

## 2016-12-23 NOTE — Progress Notes (Signed)
Patient came in to have blood drawn for testing per Dr. Eustace Moore Klein's orders.

## 2016-12-24 LAB — CMP12+LP+TP+TSH+6AC+CBC/D/PLT
ALBUMIN: 4.5 g/dL (ref 3.5–5.5)
ALT: 46 IU/L — AB (ref 0–44)
AST: 52 IU/L — AB (ref 0–40)
Albumin/Globulin Ratio: 1.5 (ref 1.2–2.2)
Alkaline Phosphatase: 122 IU/L — ABNORMAL HIGH (ref 39–117)
BASOS: 1 %
BUN/Creatinine Ratio: 13 (ref 9–20)
BUN: 14 mg/dL (ref 6–24)
Basophils Absolute: 0 10*3/uL (ref 0.0–0.2)
Bilirubin Total: 0.4 mg/dL (ref 0.0–1.2)
CALCIUM: 9.4 mg/dL (ref 8.7–10.2)
CHLORIDE: 104 mmol/L (ref 96–106)
CHOL/HDL RATIO: 6.1 ratio — AB (ref 0.0–5.0)
CHOLESTEROL TOTAL: 195 mg/dL (ref 100–199)
Creatinine, Ser: 1.08 mg/dL (ref 0.76–1.27)
EOS (ABSOLUTE): 0.3 10*3/uL (ref 0.0–0.4)
ESTIMATED CHD RISK: 1.3 times avg. — AB (ref 0.0–1.0)
Eos: 3 %
FREE THYROXINE INDEX: 2.6 (ref 1.2–4.9)
GFR calc Af Amer: 95 mL/min/{1.73_m2} (ref >59)
GFR calc non Af Amer: 82 mL/min/{1.73_m2} (ref >59)
GGT: 87 IU/L — AB (ref 0–65)
GLOBULIN, TOTAL: 3 g/dL (ref 1.5–4.5)
Glucose: 98 mg/dL (ref 65–99)
HDL: 32 mg/dL — ABNORMAL LOW (ref >39)
HEMATOCRIT: 42 % (ref 37.5–51.0)
Hemoglobin: 14.2 g/dL (ref 13.0–17.7)
IMMATURE GRANS (ABS): 0 10*3/uL (ref 0.0–0.1)
Immature Granulocytes: 0 %
Iron: 87 ug/dL (ref 38–169)
LDH: 193 IU/L (ref 121–224)
LDL CALC: 132 mg/dL — AB (ref 0–99)
LYMPHS ABS: 2.9 10*3/uL (ref 0.7–3.1)
LYMPHS: 35 %
MCH: 28.3 pg (ref 26.6–33.0)
MCHC: 33.8 g/dL (ref 31.5–35.7)
MCV: 84 fL (ref 79–97)
MONOS ABS: 0.5 10*3/uL (ref 0.1–0.9)
Monocytes: 6 %
NEUTROS ABS: 4.5 10*3/uL (ref 1.4–7.0)
Neutrophils: 55 %
PHOSPHORUS: 2.8 mg/dL (ref 2.5–4.5)
POTASSIUM: 4.5 mmol/L (ref 3.5–5.2)
Platelets: 343 10*3/uL (ref 150–379)
RBC: 5.02 x10E6/uL (ref 4.14–5.80)
RDW: 14.1 % (ref 12.3–15.4)
SODIUM: 142 mmol/L (ref 134–144)
T3 Uptake Ratio: 27 % (ref 24–39)
T4 TOTAL: 9.8 ug/dL (ref 4.5–12.0)
TRIGLYCERIDES: 154 mg/dL — AB (ref 0–149)
TSH: 3.76 u[IU]/mL (ref 0.450–4.500)
Total Protein: 7.5 g/dL (ref 6.0–8.5)
Uric Acid: 9.7 mg/dL — ABNORMAL HIGH (ref 3.7–8.6)
VLDL Cholesterol Cal: 31 mg/dL (ref 5–40)
WBC: 8.3 10*3/uL (ref 3.4–10.8)

## 2016-12-24 LAB — MICROALBUMIN / CREATININE URINE RATIO
Creatinine, Urine: 144.6 mg/dL
Microalb/Creat Ratio: 4.7 mg/g creat (ref 0.0–30.0)
Microalbumin, Urine: 6.8 ug/mL

## 2016-12-24 LAB — HGB A1C W/O EAG: Hgb A1c MFr Bld: 6.4 % — ABNORMAL HIGH (ref 4.8–5.6)

## 2017-04-03 ENCOUNTER — Ambulatory Visit: Payer: Self-pay | Admitting: Medical

## 2017-04-03 VITALS — BP 140/90 | HR 81 | Temp 98.5°F | Resp 16 | Ht 73.0 in | Wt 308.0 lb

## 2017-04-03 DIAGNOSIS — Z299 Encounter for prophylactic measures, unspecified: Secondary | ICD-10-CM

## 2017-04-03 DIAGNOSIS — L729 Follicular cyst of the skin and subcutaneous tissue, unspecified: Secondary | ICD-10-CM

## 2017-04-03 DIAGNOSIS — L089 Local infection of the skin and subcutaneous tissue, unspecified: Secondary | ICD-10-CM

## 2017-04-03 MED ORDER — AZITHROMYCIN 250 MG PO TABS: ORAL_TABLET | ORAL | 0 refills | Status: DC

## 2017-04-03 NOTE — Progress Notes (Signed)
Patient came in to have blood drawn for testing per Dr. Eustace Moore Klein's orders.  He also had his scheduled Biometric Screening per [facility] yearly requirements.

## 2017-04-03 NOTE — Progress Notes (Signed)
   Subjective:    Patient ID: Robert Skinner, male    DOB: 06/02/1970, 47 y.o.   MRN: [id number]  HPI Documented on wrong patient , fixed.

## 2017-04-04 LAB — URINALYSIS, ROUTINE W REFLEX MICROSCOPIC
Bilirubin, UA: NEGATIVE
Glucose, UA: NEGATIVE
Ketones, UA: NEGATIVE
Leukocytes, UA: NEGATIVE
Nitrite, UA: NEGATIVE
PH UA: 5.5 (ref 5.0–7.5)
Protein, UA: NEGATIVE
RBC, UA: NEGATIVE
Specific Gravity, UA: 1.021 (ref 1.005–1.030)
UUROB: 0.2 mg/dL (ref 0.2–1.0)

## 2017-04-04 LAB — CMP12+LP+TP+TSH+6AC+CBC/D/PLT
ALK PHOS: 162 IU/L — AB (ref 39–117)
ALT: 56 IU/L — ABNORMAL HIGH (ref 0–44)
AST: 63 IU/L — AB (ref 0–40)
Albumin/Globulin Ratio: 1.6 (ref 1.2–2.2)
Albumin: 4.6 g/dL (ref 3.5–5.5)
BASOS: 0 %
BILIRUBIN TOTAL: 0.6 mg/dL (ref 0.0–1.2)
BUN / CREAT RATIO: 13 (ref 9–20)
BUN: 15 mg/dL (ref 6–24)
Basophils Absolute: 0 10*3/uL (ref 0.0–0.2)
CHLORIDE: 103 mmol/L (ref 96–106)
Calcium: 9.5 mg/dL (ref 8.7–10.2)
Chol/HDL Ratio: 7.9 ratio — ABNORMAL HIGH (ref 0.0–5.0)
Cholesterol, Total: 245 mg/dL — ABNORMAL HIGH (ref 100–199)
Creatinine, Ser: 1.12 mg/dL (ref 0.76–1.27)
EOS (ABSOLUTE): 0.3 10*3/uL (ref 0.0–0.4)
EOS: 3 %
ESTIMATED CHD RISK: 1.6 times avg. — AB (ref 0.0–1.0)
FREE THYROXINE INDEX: 2.3 (ref 1.2–4.9)
GFR calc non Af Amer: 78 mL/min/{1.73_m2} (ref >59)
GFR, EST AFRICAN AMERICAN: 91 mL/min/{1.73_m2} (ref >59)
GGT: 190 IU/L — ABNORMAL HIGH (ref 0–65)
Globulin, Total: 2.9 g/dL (ref 1.5–4.5)
Glucose: 127 mg/dL — ABNORMAL HIGH (ref 65–99)
HDL: 31 mg/dL — ABNORMAL LOW (ref >39)
HEMATOCRIT: 43.8 % (ref 37.5–51.0)
HEMOGLOBIN: 14.6 g/dL (ref 13.0–17.7)
IMMATURE GRANULOCYTES: 0 %
Immature Grans (Abs): 0 10*3/uL (ref 0.0–0.1)
Iron: 73 ug/dL (ref 38–169)
LDH: 210 IU/L (ref 121–224)
LDL CALC: 183 mg/dL — AB (ref 0–99)
LYMPHS ABS: 3.4 10*3/uL — AB (ref 0.7–3.1)
Lymphs: 31 %
MCH: 28.2 pg (ref 26.6–33.0)
MCHC: 33.3 g/dL (ref 31.5–35.7)
MCV: 85 fL (ref 79–97)
Monocytes Absolute: 0.8 10*3/uL (ref 0.1–0.9)
Monocytes: 7 %
NEUTROS PCT: 59 %
Neutrophils Absolute: 6.6 10*3/uL (ref 1.4–7.0)
Phosphorus: 2.8 mg/dL (ref 2.5–4.5)
Platelets: 388 10*3/uL — ABNORMAL HIGH (ref 150–379)
Potassium: 4.2 mmol/L (ref 3.5–5.2)
RBC: 5.17 x10E6/uL (ref 4.14–5.80)
RDW: 14.6 % (ref 12.3–15.4)
SODIUM: 141 mmol/L (ref 134–144)
T3 Uptake Ratio: 23 % — ABNORMAL LOW (ref 24–39)
T4, Total: 10.2 ug/dL (ref 4.5–12.0)
TSH: 5.06 u[IU]/mL — ABNORMAL HIGH (ref 0.450–4.500)
Total Protein: 7.5 g/dL (ref 6.0–8.5)
Triglycerides: 153 mg/dL — ABNORMAL HIGH (ref 0–149)
Uric Acid: 10.7 mg/dL — ABNORMAL HIGH (ref 3.7–8.6)
VLDL CHOLESTEROL CAL: 31 mg/dL (ref 5–40)
WBC: 11.1 10*3/uL — ABNORMAL HIGH (ref 3.4–10.8)

## 2017-04-04 LAB — MICROALBUMIN / CREATININE URINE RATIO
CREATININE, UR: 214.1 mg/dL
MICROALBUM., U, RANDOM: 22 ug/mL
Microalb/Creat Ratio: 10.3 mg/g creat (ref 0.0–30.0)

## 2017-04-04 LAB — TESTOSTERONE: Testosterone: 145 ng/dL — ABNORMAL LOW (ref 264–916)

## 2017-04-04 LAB — HGB A1C W/O EAG: HEMOGLOBIN A1C: 6.6 % — AB (ref 4.8–5.6)

## 2017-04-09 NOTE — Progress Notes (Signed)
Lab results were faxed to Dr. Ramonita Lab at [facility] per patient's request.

## 2017-04-17 ENCOUNTER — Other Ambulatory Visit: Payer: Self-pay | Admitting: Internal Medicine

## 2017-04-17 DIAGNOSIS — R7989 Other specified abnormal findings of blood chemistry: Secondary | ICD-10-CM

## 2017-04-17 DIAGNOSIS — R945 Abnormal results of liver function studies: Principal | ICD-10-CM

## 2017-04-30 ENCOUNTER — Other Ambulatory Visit: Payer: Worker's Compensation

## 2017-04-30 DIAGNOSIS — R945 Abnormal results of liver function studies: Principal | ICD-10-CM

## 2017-04-30 DIAGNOSIS — Z299 Encounter for prophylactic measures, unspecified: Secondary | ICD-10-CM

## 2017-04-30 DIAGNOSIS — R7989 Other specified abnormal findings of blood chemistry: Secondary | ICD-10-CM

## 2017-05-02 LAB — COMPREHENSIVE METABOLIC PANEL
ALBUMIN: 4.6 g/dL (ref 3.5–5.5)
ALT: 75 IU/L — AB (ref 0–44)
AST: 78 IU/L — ABNORMAL HIGH (ref 0–40)
Albumin/Globulin Ratio: 1.5 (ref 1.2–2.2)
Alkaline Phosphatase: 173 IU/L — ABNORMAL HIGH (ref 39–117)
BUN / CREAT RATIO: 16 (ref 9–20)
BUN: 17 mg/dL (ref 6–24)
Bilirubin Total: 0.4 mg/dL (ref 0.0–1.2)
CALCIUM: 9.5 mg/dL (ref 8.7–10.2)
CO2: 20 mmol/L (ref 20–29)
CREATININE: 1.09 mg/dL (ref 0.76–1.27)
Chloride: 107 mmol/L — ABNORMAL HIGH (ref 96–106)
GFR, EST AFRICAN AMERICAN: 94 mL/min/{1.73_m2} (ref >59)
GFR, EST NON AFRICAN AMERICAN: 81 mL/min/{1.73_m2} (ref >59)
GLUCOSE: 89 mg/dL (ref 65–99)
Globulin, Total: 3 g/dL (ref 1.5–4.5)
Potassium: 4.3 mmol/L (ref 3.5–5.2)
Sodium: 144 mmol/L (ref 134–144)
TOTAL PROTEIN: 7.6 g/dL (ref 6.0–8.5)

## 2017-05-02 LAB — HBV/HCV (PROFILE VIII)
HEP B C TOTAL AB: NEGATIVE
Hep B C IgM: NEGATIVE
Hep B E Ab: NEGATIVE
Hep B E Ag: NEGATIVE
Hep B Surface Ab, Qual: REACTIVE
Hepatitis B Surface Ag: NEGATIVE

## 2017-05-02 LAB — HCV COMMENT:

## 2017-05-03 LAB — IRON,TIBC AND FERRITIN PANEL
FERRITIN: 447 ng/mL — AB (ref 30–400)
IRON: 70 ug/dL (ref 38–169)
Iron Saturation: 26 % (ref 15–55)
Total Iron Binding Capacity: 273 ug/dL (ref 250–450)
UIBC: 203 ug/dL (ref 111–343)

## 2017-05-03 LAB — HEPATITIS B SURFACE ANTIGEN: HEP B S AG: NEGATIVE

## 2017-05-03 LAB — HEPATITIS C ANTIBODY: Hep C Virus Ab: <0.1 s/co ratio (ref 0.0–0.9)

## 2017-05-03 LAB — TISSUE TRANSGLUTAMINASE ABS,IGG,IGA: Transglutaminase IgA: <2 U/mL (ref 0–3)

## 2017-05-03 LAB — ANA: Anti Nuclear Antibody(ANA): NEGATIVE

## 2017-05-03 LAB — TSH+FREE T4
Free T4: 1.34 ng/dL (ref 0.82–1.77)
TSH: 5.75 u[IU]/mL — AB (ref 0.450–4.500)

## 2017-05-03 LAB — ALPHA-1-ANTITRYPSIN: A1 ANTITRYPSIN: 123 mg/dL (ref 90–200)

## 2017-05-03 LAB — HEPATITIS A ANTIBODY, TOTAL: Hep A Total Ab: NEGATIVE

## 2017-05-03 LAB — MITOCHONDRIAL ANTIBODIES: Mitochondrial Ab: <20 Units (ref 0.0–20.0)

## 2017-05-03 LAB — ANTI-SMOOTH MUSCLE ANTIBODY, IGG: Smooth Muscle Ab: 11 Units (ref 0–19)

## 2017-06-09 ENCOUNTER — Encounter: Payer: Self-pay | Admitting: Family Medicine

## 2017-07-07 ENCOUNTER — Other Ambulatory Visit: Payer: Managed Care, Other (non HMO)

## 2017-07-07 DIAGNOSIS — M109 Gout, unspecified: Secondary | ICD-10-CM

## 2017-07-07 DIAGNOSIS — R7989 Other specified abnormal findings of blood chemistry: Secondary | ICD-10-CM

## 2017-07-07 DIAGNOSIS — E781 Pure hyperglyceridemia: Secondary | ICD-10-CM

## 2017-07-07 DIAGNOSIS — R739 Hyperglycemia, unspecified: Secondary | ICD-10-CM

## 2017-07-08 LAB — CBC WITH DIFFERENTIAL/PLATELET
BASOS ABS: 0.1 10*3/uL (ref 0.0–0.2)
Basos: 1 %
EOS (ABSOLUTE): 0.3 10*3/uL (ref 0.0–0.4)
Eos: 3 %
Hematocrit: 45.3 % (ref 37.5–51.0)
Hemoglobin: 14.8 g/dL (ref 13.0–17.7)
IMMATURE GRANS (ABS): 0 10*3/uL (ref 0.0–0.1)
Immature Granulocytes: 0 %
LYMPHS ABS: 3.3 10*3/uL — AB (ref 0.7–3.1)
LYMPHS: 35 %
MCH: 27.7 pg (ref 26.6–33.0)
MCHC: 32.7 g/dL (ref 31.5–35.7)
MCV: 85 fL (ref 79–97)
Monocytes Absolute: 0.6 10*3/uL (ref 0.1–0.9)
Monocytes: 6 %
NEUTROS ABS: 5.3 10*3/uL (ref 1.4–7.0)
Neutrophils: 55 %
Platelets: 352 10*3/uL (ref 150–379)
RBC: 5.35 x10E6/uL (ref 4.14–5.80)
RDW: 14.9 % (ref 12.3–15.4)
WBC: 9.6 10*3/uL (ref 3.4–10.8)

## 2017-07-08 LAB — LIPID PANEL
Chol/HDL Ratio: 7.4 ratio — ABNORMAL HIGH (ref 0.0–5.0)
Cholesterol, Total: 236 mg/dL — ABNORMAL HIGH (ref 100–199)
HDL: 32 mg/dL — AB (ref >39)
LDL Calculated: 167 mg/dL — ABNORMAL HIGH (ref 0–99)
TRIGLYCERIDES: 184 mg/dL — AB (ref 0–149)
VLDL Cholesterol Cal: 37 mg/dL (ref 5–40)

## 2017-07-08 LAB — URINALYSIS, ROUTINE W REFLEX MICROSCOPIC
BILIRUBIN UA: NEGATIVE
GLUCOSE, UA: NEGATIVE
Ketones, UA: NEGATIVE
LEUKOCYTES UA: NEGATIVE
NITRITE UA: NEGATIVE
Protein, UA: NEGATIVE
RBC UA: NEGATIVE
SPEC GRAV UA: 1.016 (ref 1.005–1.030)
Urobilinogen, Ur: 0.2 mg/dL (ref 0.2–1.0)
pH, UA: 5 (ref 5.0–7.5)

## 2017-07-08 LAB — MICROALBUMIN / CREATININE URINE RATIO
Creatinine, Urine: 135 mg/dL
MICROALB/CREAT RATIO: 3.8 mg/g{creat} (ref 0.0–30.0)
Microalbumin, Urine: 5.1 ug/mL

## 2017-07-08 LAB — COMPREHENSIVE METABOLIC PANEL
ALK PHOS: 145 IU/L — AB (ref 39–117)
ALT: 42 IU/L (ref 0–44)
AST: 45 IU/L — AB (ref 0–40)
Albumin/Globulin Ratio: 1.4 (ref 1.2–2.2)
Albumin: 4.5 g/dL (ref 3.5–5.5)
BILIRUBIN TOTAL: 0.4 mg/dL (ref 0.0–1.2)
BUN/Creatinine Ratio: 12 (ref 9–20)
BUN: 13 mg/dL (ref 6–24)
CHLORIDE: 104 mmol/L (ref 96–106)
CO2: 19 mmol/L — ABNORMAL LOW (ref 20–29)
Calcium: 9.9 mg/dL (ref 8.7–10.2)
Creatinine, Ser: 1.08 mg/dL (ref 0.76–1.27)
GFR calc Af Amer: 94 mL/min/{1.73_m2} (ref >59)
GFR calc non Af Amer: 81 mL/min/{1.73_m2} (ref >59)
GLUCOSE: 133 mg/dL — AB (ref 65–99)
Globulin, Total: 3.3 g/dL (ref 1.5–4.5)
POTASSIUM: 4.3 mmol/L (ref 3.5–5.2)
Sodium: 144 mmol/L (ref 134–144)
Total Protein: 7.8 g/dL (ref 6.0–8.5)

## 2017-07-08 LAB — HGB A1C W/O EAG: HEMOGLOBIN A1C: 6.5 % — AB (ref 4.8–5.6)

## 2017-07-08 LAB — TESTOSTERONE: TESTOSTERONE: 179 ng/dL — AB (ref 264–916)

## 2017-07-08 LAB — TSH: TSH: 6.69 u[IU]/mL — ABNORMAL HIGH (ref 0.450–4.500)

## 2017-07-08 LAB — URIC ACID: URIC ACID: 9.5 mg/dL — AB (ref 3.7–8.6)

## 2017-08-07 ENCOUNTER — Other Ambulatory Visit: Payer: Worker's Compensation

## 2017-08-07 DIAGNOSIS — R739 Hyperglycemia, unspecified: Secondary | ICD-10-CM

## 2017-08-07 DIAGNOSIS — M109 Gout, unspecified: Secondary | ICD-10-CM

## 2017-08-07 DIAGNOSIS — E039 Hypothyroidism, unspecified: Secondary | ICD-10-CM

## 2017-08-08 LAB — COMPREHENSIVE METABOLIC PANEL
ALBUMIN: 4.1 g/dL (ref 3.5–5.5)
ALT: 58 IU/L — ABNORMAL HIGH (ref 0–44)
AST: 82 IU/L — ABNORMAL HIGH (ref 0–40)
Albumin/Globulin Ratio: 1.4 (ref 1.2–2.2)
Alkaline Phosphatase: 120 IU/L — ABNORMAL HIGH (ref 39–117)
BUN / CREAT RATIO: 16 (ref 9–20)
BUN: 19 mg/dL (ref 6–24)
Bilirubin Total: 0.3 mg/dL (ref 0.0–1.2)
CALCIUM: 9.4 mg/dL (ref 8.7–10.2)
CO2: 20 mmol/L (ref 20–29)
CREATININE: 1.17 mg/dL (ref 0.76–1.27)
Chloride: 105 mmol/L (ref 96–106)
GFR calc Af Amer: 85 mL/min/{1.73_m2} (ref >59)
GFR, EST NON AFRICAN AMERICAN: 74 mL/min/{1.73_m2} (ref >59)
GLUCOSE: 122 mg/dL — AB (ref 65–99)
Globulin, Total: 2.9 g/dL (ref 1.5–4.5)
Potassium: 4.8 mmol/L (ref 3.5–5.2)
SODIUM: 140 mmol/L (ref 134–144)
TOTAL PROTEIN: 7 g/dL (ref 6.0–8.5)

## 2017-08-08 LAB — URIC ACID: Uric Acid: 5.3 mg/dL (ref 3.7–8.6)

## 2017-08-08 LAB — TSH: TSH: 6 u[IU]/mL — AB (ref 0.450–4.500)

## 2018-01-19 ENCOUNTER — Other Ambulatory Visit: Payer: Self-pay

## 2018-01-19 DIAGNOSIS — R739 Hyperglycemia, unspecified: Secondary | ICD-10-CM

## 2018-01-19 DIAGNOSIS — E781 Pure hyperglyceridemia: Secondary | ICD-10-CM

## 2018-01-19 DIAGNOSIS — E039 Hypothyroidism, unspecified: Secondary | ICD-10-CM

## 2018-01-20 LAB — HGB A1C W/O EAG: HEMOGLOBIN A1C: 6.6 % — AB (ref 4.8–5.6)

## 2018-01-20 LAB — COMPREHENSIVE METABOLIC PANEL
ALBUMIN: 4.3 g/dL (ref 3.5–5.5)
ALK PHOS: 121 IU/L — AB (ref 39–117)
ALT: 62 IU/L — ABNORMAL HIGH (ref 0–44)
AST: 83 IU/L — ABNORMAL HIGH (ref 0–40)
Albumin/Globulin Ratio: 1.3 (ref 1.2–2.2)
BUN / CREAT RATIO: 12 (ref 9–20)
BUN: 14 mg/dL (ref 6–24)
Bilirubin Total: 0.6 mg/dL (ref 0.0–1.2)
CHLORIDE: 103 mmol/L (ref 96–106)
CO2: 20 mmol/L (ref 20–29)
Calcium: 9.7 mg/dL (ref 8.7–10.2)
Creatinine, Ser: 1.2 mg/dL (ref 0.76–1.27)
GFR calc Af Amer: 83 mL/min/{1.73_m2} (ref >59)
GFR calc non Af Amer: 72 mL/min/{1.73_m2} (ref >59)
Globulin, Total: 3.4 g/dL (ref 1.5–4.5)
Glucose: 137 mg/dL — ABNORMAL HIGH (ref 65–99)
Potassium: 4.3 mmol/L (ref 3.5–5.2)
Sodium: 141 mmol/L (ref 134–144)
Total Protein: 7.7 g/dL (ref 6.0–8.5)

## 2018-01-20 LAB — LIPID PANEL W/O CHOL/HDL RATIO
Cholesterol, Total: 240 mg/dL — ABNORMAL HIGH (ref 100–199)
HDL: 32 mg/dL — ABNORMAL LOW (ref >39)
LDL Calculated: 174 mg/dL — ABNORMAL HIGH (ref 0–99)
TRIGLYCERIDES: 168 mg/dL — AB (ref 0–149)
VLDL Cholesterol Cal: 34 mg/dL (ref 5–40)

## 2018-01-20 LAB — TSH: TSH: 3.87 u[IU]/mL (ref 0.450–4.500)

## 2018-01-20 NOTE — Progress Notes (Signed)
Lab results from 01/19/18 have been routed to ordering Provider Ramonita Lab III MD  -[place]
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
## 2018-03-18 ENCOUNTER — Emergency Department
Admission: EM | Admit: 2018-03-18 | Discharge: 2018-03-18 | Disposition: A | Discharge status: Home / Self Care | Payer: Managed Care, Other (non HMO) | Attending: Student in an Organized Health Care Education/Training Program | Admitting: Student in an Organized Health Care Education/Training Program

## 2018-03-18 ENCOUNTER — Encounter: Payer: Self-pay | Admitting: Emergency Medicine

## 2018-03-18 ENCOUNTER — Other Ambulatory Visit: Payer: Self-pay

## 2018-03-18 DIAGNOSIS — I10 Essential (primary) hypertension: Secondary | ICD-10-CM | POA: Diagnosis not present

## 2018-03-18 DIAGNOSIS — R040 Epistaxis: Secondary | ICD-10-CM | POA: Diagnosis present

## 2018-03-18 DIAGNOSIS — Z79899 Other long term (current) drug therapy: Secondary | ICD-10-CM | POA: Insufficient documentation

## 2018-03-18 HISTORY — DX: Essential (primary) hypertension: I10

## 2018-03-18 LAB — CBC WITH DIFFERENTIAL/PLATELET
Abs Immature Granulocytes: 0.07 10*3/uL (ref 0.00–0.07)
BASOS PCT: 1 %
Basophils Absolute: 0.1 10*3/uL (ref 0.0–0.1)
EOS ABS: 0.2 10*3/uL (ref 0.0–0.5)
EOS PCT: 2 %
HEMATOCRIT: 43.3 % (ref 39.0–52.0)
Hemoglobin: 14.6 g/dL (ref 13.0–17.0)
Immature Granulocytes: 1 %
LYMPHS ABS: 3 10*3/uL (ref 0.7–4.0)
Lymphocytes Relative: 22 %
MCH: 28.6 pg (ref 26.0–34.0)
MCHC: 33.7 g/dL (ref 30.0–36.0)
MCV: 84.9 fL (ref 80.0–100.0)
Monocytes Absolute: 0.8 10*3/uL (ref 0.1–1.0)
Monocytes Relative: 6 %
Neutro Abs: 9.2 10*3/uL — ABNORMAL HIGH (ref 1.7–7.7)
Neutrophils Relative %: 68 %
Platelets: 360 10*3/uL (ref 150–400)
RBC: 5.1 MIL/uL (ref 4.22–5.81)
RDW: 14.4 % (ref 11.5–15.5)
WBC: 13.3 10*3/uL — ABNORMAL HIGH (ref 4.0–10.5)
nRBC: 0 % (ref 0.0–0.2)

## 2018-03-18 LAB — COMPREHENSIVE METABOLIC PANEL
ALT: 46 U/L — ABNORMAL HIGH (ref 0–44)
ANION GAP: 9 (ref 5–15)
AST: 56 U/L — ABNORMAL HIGH (ref 15–41)
Albumin: 4.3 g/dL (ref 3.5–5.0)
Alkaline Phosphatase: 123 U/L (ref 38–126)
BUN: 16 mg/dL (ref 6–20)
CO2: 21 mmol/L — ABNORMAL LOW (ref 22–32)
Calcium: 9.4 mg/dL (ref 8.9–10.3)
Chloride: 107 mmol/L (ref 98–111)
Creatinine, Ser: 1.26 mg/dL — ABNORMAL HIGH (ref 0.61–1.24)
GFR calc Af Amer: >60 mL/min (ref >60)
Glucose, Bld: 130 mg/dL — ABNORMAL HIGH (ref 70–99)
POTASSIUM: 3.8 mmol/L (ref 3.5–5.1)
Sodium: 137 mmol/L (ref 135–145)
Total Bilirubin: 0.9 mg/dL (ref 0.3–1.2)
Total Protein: 8.3 g/dL — ABNORMAL HIGH (ref 6.5–8.1)

## 2018-03-18 MED ORDER — AZITHROMYCIN 250 MG PO TABS: ORAL_TABLET | ORAL | 0 refills | Status: DC

## 2018-03-18 MED ORDER — PREDNISONE 10 MG PO TABS: ORAL_TABLET | ORAL | 0 refills | Status: DC

## 2018-03-18 NOTE — ED Triage Notes (Signed)
Pt from [facility] with nosebleed. Per staff, bleeding has been going on x2hrs. PT has nose clamp in place at this time with bleeding controlled. Denies any anticoag use.

## 2018-03-18 NOTE — ED Triage Notes (Signed)
Nose bleed for 1 hour.  Went to [place] brought him here.  Has nose clip in place.  History of htn.  No blood thinners.

## 2018-03-18 NOTE — ED Notes (Signed)
Reports nose bleed that began at noon today has not stop despite pressure. Patient denies utilizing blood thinners. Nose clamp applied in triage.

## 2018-03-18 NOTE — Discharge Instructions (Signed)
Follow-up with Dr. Aundra Dubin if any continued problems with nosebleeds.  Avoid anti-inflammatory medication at this time.  You may take Tylenol if needed.  Discontinue using any nose sprays.  Use humidifier especially at night in your home.  If at all possible reduce the temperature of your thermostat at night.  Avoid blowing your nose today.  If any restart of your nosebleed use the nasal clamp provided for you and leave on for 15 to 30 minutes.  If you continue to have nasal bleeding after that period of time you may need to return to the emergency department.  Also increase fluids to stay hydrated.  A Z-Pak and prednisone prescription was sent to your pharmacy.
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
## 2018-08-26 ENCOUNTER — Other Ambulatory Visit: Payer: Managed Care, Other (non HMO)

## 2018-08-26 ENCOUNTER — Ambulatory Visit: Payer: Managed Care, Other (non HMO) | Admitting: Adult Health

## 2018-08-26 ENCOUNTER — Other Ambulatory Visit: Payer: Self-pay

## 2018-08-26 VITALS — BP 140/82 | HR 86 | Temp 98.2°F | Resp 16 | Ht 71.0 in | Wt 292.0 lb

## 2018-08-26 DIAGNOSIS — E781 Pure hyperglyceridemia: Secondary | ICD-10-CM | POA: Diagnosis not present

## 2018-08-26 DIAGNOSIS — M109 Gout, unspecified: Secondary | ICD-10-CM | POA: Diagnosis not present

## 2018-08-26 DIAGNOSIS — Z008 Encounter for other general examination: Secondary | ICD-10-CM

## 2018-08-26 DIAGNOSIS — Z0189 Encounter for other specified special examinations: Secondary | ICD-10-CM | POA: Diagnosis not present

## 2018-08-26 DIAGNOSIS — E039 Hypothyroidism, unspecified: Secondary | ICD-10-CM | POA: Diagnosis not present

## 2018-08-26 DIAGNOSIS — R739 Hyperglycemia, unspecified: Secondary | ICD-10-CM | POA: Diagnosis not present

## 2018-08-26 NOTE — Addendum Note (Signed)
Addended by: Judie Petit on: 08/26/2018 10:17 AM   Modules accepted: Orders

## 2018-08-26 NOTE — Progress Notes (Signed)
[facility]  Buena Irish DOB: 48 y.o. MRN: [id number]  Subjective:  Here for Biometric Screen/brief exam Patient is a 48 year old male in no acute distress who comes to the clinic for his biometric.  He has a history of hypertension , hypothyroidism and gout, he is followed by primary care Adin Hector, MD  He denies any concerns at this time and reports he has a full physical with Adin Hector, MD om  Patient  denies any fever, body aches,chills, rash, chest pain, shortness of breath, nausea, vomiting, or diarrhea.   Objective: Blood pressure 140/82, pulse 86, temperature 98.2 F (36.8 C), temperature source Oral, resp. rate 16, height 5\' 11"  (1.803 m), weight 292 lb (132.5 kg), SpO2 96 %. NAD HEENT: Within normal limits Neck: Normal, supple, thyroid normal  Heart: Regular rate and rhythm Lungs: Clear Patient is alert and oriented and responsive to questions Engages in eye contact with provider. Speaks in full sentences without any pauses without any shortness of breath or distress.   Patient moves on and off of exam table and in room without difficulty. Gait is normal in hall and in room. Patient is oriented to person place time and situation. Patient answers questions appropriately and engages in conversation.  Assessment: Biometric screen   Plan: Follow up with Tama High III, MD for your full physical.   Fasting glucose and lipids. Discussed with patient that today's visit here is a limited biometric screening visit (not a comprehensive exam or management of any chronic problems) Discussed some health issues, including healthy eating habits and exercise. Encouraged to follow-up with PCP for annual comprehensive preventive and wellness care (and if applicable, any chronic issues). Questions invited and answered.  An After Visit Summary was printed and given to the patient.  I will have the office call you on your  glucose and cholesterol results when they return if you have not heard within 1 week please call the office.  This biometric physical is a brief physical and the only labs done are glucose and your lipid panel(cholesterol) and is  not a substitute for seeing a primary care provider for a complete annual physical. Please see a primary care physician for routine health maintenance, labs and full physical at least yearly and follow up as recommended by your provider. Provider also recommends if you do not have a primary care provider for patient to establish care as soon as possible .Patient may chose provider of choice. Also gave the [place] at [phone] or web site at [email or web address] to help assist with finding a primary care doctor.  Patient verbalizes understanding that his office is acute care only and not a substitute for a primary care or for the management of chronic conditions.    Follow up with primary care as needed for chronic and maintenance health care- can be seen in this employee clinic for acute care.

## 2018-08-26 NOTE — Addendum Note (Signed)
Addended by: Judie Petit on: 08/26/2018 11:25 AM   Modules accepted: Orders

## 2018-08-26 NOTE — Addendum Note (Signed)
Addended by: Judie Petit on: 08/26/2018 09:53 AM   Modules accepted: Level of Service

## 2018-08-26 NOTE — Addendum Note (Signed)
Addended by: Judie Petit on: 08/26/2018 11:22 AM   Modules accepted: Orders

## 2018-08-26 NOTE — Addendum Note (Signed)
Addended by: Judie Petit on: 08/26/2018 10:24 AM   Modules accepted: Orders

## 2018-08-26 NOTE — Addendum Note (Signed)
Addended by: Judie Petit on: 08/26/2018 10:12 AM   Modules accepted: Orders

## 2018-08-26 NOTE — Addendum Note (Signed)
Addended by: Judie Petit on: 08/26/2018 11:54 AM   Modules accepted: Orders

## 2018-08-26 NOTE — Addendum Note (Signed)
Addended by: Judie Petit on: 08/26/2018 11:16 AM   Modules accepted: Orders

## 2018-08-26 NOTE — Addendum Note (Signed)
Addended by: Judie Petit on: 08/26/2018 10:33 AM   Modules accepted: Orders

## 2018-08-26 NOTE — Addendum Note (Signed)
Addended by: Judie Petit on: 08/26/2018 10:08 AM   Modules accepted: Orders

## 2018-08-26 NOTE — Patient Instructions (Signed)
I will have the office call you on your glucose and cholesterol results when they return if you have not heard within 1 week please call the office.  This biometric physical is a brief physical and the only labs done are glucose and your lipid panel(cholesterol) and is  not a substitute for seeing a primary care provider for a complete annual physical. Please see a primary care physician for routine health maintenance, labs and full physical at least yearly and follow up as recommended by your provider. Provider also recommends if you do not have a primary care provider for patient to establish care as soon as possible .Patient may chose provider of choice. Also gave the [place] [place] at [phone] or web site at [email or web address] to help assist with finding a primary care doctor.  Patient verbalizes understanding that his office is acute care only and not a substitute for a primary care or for the management of chronic conditions.   ,  Health Maintenance, Male A healthy lifestyle and preventive care is important for your health and wellness. Ask your health care provider about what schedule of regular examinations is right for you. What should I know about weight and diet? Eat a Healthy Diet  Eat plenty of vegetables, fruits, whole grains, low-fat dairy products, and lean protein.  Do not eat a lot of foods high in solid fats, added sugars, or salt.  Maintain a Healthy Weight Regular exercise can help you achieve or maintain a healthy weight. You should:  Do at least 150 minutes of exercise each week. The exercise should increase your heart rate and make you sweat (moderate-intensity exercise).  Do strength-training exercises at least twice a week. Watch Your Levels of Cholesterol and Blood Lipids  Have your blood tested for lipids and cholesterol every 5 years starting at 48 years of age. If you are at high risk for heart disease, you should start having  your blood tested when you are 48 years old. You may need to have your cholesterol levels checked more often if: ? Your lipid or cholesterol levels are high. ? You are older than 48 years of age. ? You are at high risk for heart disease. What should I know about cancer screening? Many types of cancers can be detected early and may often be prevented. Lung Cancer  You should be screened every year for lung cancer if: ? You are a current smoker who has smoked for at least 30 years. ? You are a former smoker who has quit within the past 15 years.  Talk to your health care provider about your screening options, when you should start screening, and how often you should be screened. Colorectal Cancer  Routine colorectal cancer screening usually begins at 48 years of age and should be repeated every 5-10 years until you are 49 years old. You may need to be screened more often if early forms of precancerous polyps or small growths are found. Your health care provider may recommend screening at an earlier age if you have risk factors for colon cancer.  Your health care provider may recommend using home test kits to check for hidden blood in the stool.  A small camera at the end of a tube can be used to examine your colon (sigmoidoscopy or colonoscopy). This checks for the earliest forms of colorectal cancer. Prostate and Testicular Cancer  Depending on your age and overall health, your health care provider may do certain  tests to screen for prostate and testicular cancer.  Talk to your health care provider about any symptoms or concerns you have about testicular or prostate cancer. Skin Cancer  Check your skin from head to toe regularly.  Tell your health care provider about any new moles or changes in moles, especially if: ? There is a change in a mole's size, shape, or color. ? You have a mole that is larger than a pencil eraser.  Always use sunscreen. Apply sunscreen liberally and repeat  throughout the day.  Protect yourself by wearing long sleeves, pants, a wide-brimmed hat, and sunglasses when outside. What should I know about heart disease, diabetes, and high blood pressure?  If you are 40-62 years of age, have your blood pressure checked every 3-5 years. If you are 88 years of age or older, have your blood pressure checked every year. You should have your blood pressure measured twice-once when you are at a hospital or clinic, and once when you are not at a hospital or clinic. Record the average of the two measurements. To check your blood pressure when you are not at a hospital or clinic, you can use: ? An automated blood pressure machine at a pharmacy. ? A home blood pressure monitor.  Talk to your health care provider about your target blood pressure.  If you are between 61-64 years old, ask your health care provider if you should take aspirin to prevent heart disease.  Have regular diabetes screenings by checking your fasting blood sugar level. ? If you are at a normal weight and have a low risk for diabetes, have this test once every three years after the age of 70. ? If you are overweight and have a high risk for diabetes, consider being tested at a younger age or more often.  A one-time screening for abdominal aortic aneurysm (AAA) by ultrasound is recommended for men aged 39-75 years who are current or former smokers. What should I know about preventing infection? Hepatitis B If you have a higher risk for hepatitis B, you should be screened for this virus. Talk with your health care provider to find out if you are at risk for hepatitis B infection. Hepatitis C Blood testing is recommended for:  Everyone born from [birth year] through 1965.  Anyone with known risk factors for hepatitis C. Sexually Transmitted Diseases (STDs)  You should be screened each year for STDs including gonorrhea and chlamydia if: ? You are sexually active and are younger than 48 years of age.  ? You are older than 48 years of age and your health care provider tells you that you are at risk for this type of infection. ? Your sexual activity has changed since you were last screened and you are at an increased risk for chlamydia or gonorrhea. Ask your health care provider if you are at risk.  Talk with your health care provider about whether you are at high risk of being infected with HIV. Your health care provider may recommend a prescription medicine to help prevent HIV infection. What else can I do?  Schedule regular health, dental, and eye exams.  Stay current with your vaccines (immunizations).  Do not use any tobacco products, such as cigarettes, chewing tobacco, and e-cigarettes. If you need help quitting, ask your health care provider.  Limit alcohol intake to no more than 2 drinks per day. One drink equals 12 ounces of beer, 5 ounces of wine, or 1 ounces of hard liquor.  Do not  use street drugs.  Do not share needles.  Ask your health care provider for help if you need support or information about quitting drugs.  Tell your health care provider if you often feel depressed.  Tell your health care provider if you have ever been abused or do not feel safe at home. This information is not intended to replace advice given to you by your health care provider. Make sure you discuss any questions you have with your health care provider. Document Released: 08/30/2007 Document Revised: 10/31/2015 Document Reviewed: 12/05/2014 Elsevier Interactive Patient Education  2019 [employer].

## 2018-08-26 NOTE — Addendum Note (Signed)
Addended by: Judie Petit on: 08/26/2018 11:12 AM   Modules accepted: Orders

## 2018-08-26 NOTE — Addendum Note (Signed)
Addended by: Judie Petit on: 08/26/2018 10:32 AM   Modules accepted: Orders

## 2018-08-27 LAB — COMPREHENSIVE METABOLIC PANEL
ALT: 48 IU/L — ABNORMAL HIGH (ref 0–44)
AST: 69 IU/L — ABNORMAL HIGH (ref 0–40)
Albumin/Globulin Ratio: 1.4 (ref 1.2–2.2)
Albumin: 4.4 g/dL (ref 4.0–5.0)
Alkaline Phosphatase: 129 IU/L — ABNORMAL HIGH (ref 39–117)
BUN/Creatinine Ratio: 17 (ref 9–20)
BUN: 18 mg/dL (ref 6–24)
Bilirubin Total: 0.4 mg/dL (ref 0.0–1.2)
CO2: 18 mmol/L — ABNORMAL LOW (ref 20–29)
Calcium: 9.5 mg/dL (ref 8.7–10.2)
Chloride: 103 mmol/L (ref 96–106)
Creatinine, Ser: 1.06 mg/dL (ref 0.76–1.27)
GFR calc Af Amer: 95 mL/min/{1.73_m2} (ref >59)
GFR calc non Af Amer: 83 mL/min/{1.73_m2} (ref >59)
Globulin, Total: 3.2 g/dL (ref 1.5–4.5)
Glucose: 133 mg/dL — ABNORMAL HIGH (ref 65–99)
Potassium: 4.3 mmol/L (ref 3.5–5.2)
Sodium: 140 mmol/L (ref 134–144)
Total Protein: 7.6 g/dL (ref 6.0–8.5)

## 2018-08-27 LAB — URIC ACID: Uric Acid: 5.4 mg/dL (ref 3.7–8.6)

## 2018-08-27 LAB — HGB A1C W/O EAG: Hgb A1c MFr Bld: 6.7 % — ABNORMAL HIGH (ref 4.8–5.6)

## 2018-08-27 LAB — TSH: TSH: 1.73 u[IU]/mL (ref 0.450–4.500)

## 2018-08-27 LAB — LIPID PANEL WITH LDL/HDL RATIO
Cholesterol, Total: 225 mg/dL — ABNORMAL HIGH (ref 100–199)
HDL: 34 mg/dL — ABNORMAL LOW (ref >39)
LDL Calculated: 161 mg/dL — ABNORMAL HIGH (ref 0–99)
LDl/HDL Ratio: 4.7 ratio — ABNORMAL HIGH (ref 0.0–3.6)
Triglycerides: 152 mg/dL — ABNORMAL HIGH (ref 0–149)
VLDL Cholesterol Cal: 30 mg/dL (ref 5–40)

## 2019-01-04 ENCOUNTER — Ambulatory Visit: Payer: 59

## 2019-01-04 ENCOUNTER — Other Ambulatory Visit: Payer: 59

## 2019-01-11 ENCOUNTER — Other Ambulatory Visit: Payer: Self-pay

## 2019-01-11 ENCOUNTER — Other Ambulatory Visit: Payer: 59

## 2019-01-11 DIAGNOSIS — M109 Gout, unspecified: Secondary | ICD-10-CM

## 2019-01-11 DIAGNOSIS — R739 Hyperglycemia, unspecified: Secondary | ICD-10-CM

## 2019-01-11 DIAGNOSIS — G4733 Obstructive sleep apnea (adult) (pediatric): Secondary | ICD-10-CM

## 2019-01-11 DIAGNOSIS — E039 Hypothyroidism, unspecified: Secondary | ICD-10-CM

## 2019-01-11 DIAGNOSIS — R7989 Other specified abnormal findings of blood chemistry: Secondary | ICD-10-CM

## 2019-01-11 DIAGNOSIS — E781 Pure hyperglyceridemia: Secondary | ICD-10-CM

## 2019-01-11 DIAGNOSIS — Z23 Encounter for immunization: Secondary | ICD-10-CM

## 2019-01-11 NOTE — Addendum Note (Signed)
Addended by: Jay Schlichter D on: 01/11/2019 08:51 AM   Modules accepted: Orders

## 2019-01-12 LAB — CBC WITH DIFFERENTIAL/PLATELET
Basophils Absolute: 0.1 10*3/uL (ref 0.0–0.2)
Basos: 1 %
EOS (ABSOLUTE): 0.3 10*3/uL (ref 0.0–0.4)
Eos: 3 %
Hematocrit: 44.8 % (ref 37.5–51.0)
Hemoglobin: 14.6 g/dL (ref 13.0–17.7)
Immature Grans (Abs): 0.1 10*3/uL (ref 0.0–0.1)
Immature Granulocytes: 1 %
Lymphocytes Absolute: 3.5 10*3/uL — ABNORMAL HIGH (ref 0.7–3.1)
Lymphs: 33 %
MCH: 28.5 pg (ref 26.6–33.0)
MCHC: 32.6 g/dL (ref 31.5–35.7)
MCV: 87 fL (ref 79–97)
Monocytes Absolute: 0.6 10*3/uL (ref 0.1–0.9)
Monocytes: 5 %
Neutrophils Absolute: 6.3 10*3/uL (ref 1.4–7.0)
Neutrophils: 57 %
Platelets: 361 10*3/uL (ref 150–450)
RBC: 5.13 x10E6/uL (ref 4.14–5.80)
RDW: 13.5 % (ref 11.6–15.4)
WBC: 10.8 10*3/uL (ref 3.4–10.8)

## 2019-01-12 LAB — COMPREHENSIVE METABOLIC PANEL
ALT: 43 IU/L (ref 0–44)
AST: 60 IU/L — ABNORMAL HIGH (ref 0–40)
Albumin/Globulin Ratio: 1.5 (ref 1.2–2.2)
Albumin: 4.5 g/dL (ref 4.0–5.0)
Alkaline Phosphatase: 143 IU/L — ABNORMAL HIGH (ref 39–117)
BUN/Creatinine Ratio: 12 (ref 9–20)
BUN: 15 mg/dL (ref 6–24)
Bilirubin Total: 0.3 mg/dL (ref 0.0–1.2)
CO2: 20 mmol/L (ref 20–29)
Calcium: 9.6 mg/dL (ref 8.7–10.2)
Chloride: 104 mmol/L (ref 96–106)
Creatinine, Ser: 1.26 mg/dL (ref 0.76–1.27)
GFR calc Af Amer: 77 mL/min/{1.73_m2} (ref >59)
GFR calc non Af Amer: 67 mL/min/{1.73_m2} (ref >59)
Globulin, Total: 3 g/dL (ref 1.5–4.5)
Glucose: 143 mg/dL — ABNORMAL HIGH (ref 65–99)
Potassium: 4.5 mmol/L (ref 3.5–5.2)
Sodium: 143 mmol/L (ref 134–144)
Total Protein: 7.5 g/dL (ref 6.0–8.5)

## 2019-01-12 LAB — URINALYSIS, ROUTINE W REFLEX MICROSCOPIC
Bilirubin, UA: NEGATIVE
Glucose, UA: NEGATIVE
Ketones, UA: NEGATIVE
Leukocytes,UA: NEGATIVE
Nitrite, UA: NEGATIVE
Protein,UA: NEGATIVE
RBC, UA: NEGATIVE
Specific Gravity, UA: 1.017 (ref 1.005–1.030)
Urobilinogen, Ur: 0.2 mg/dL (ref 0.2–1.0)
pH, UA: 7 (ref 5.0–7.5)

## 2019-01-12 LAB — LIPID PANEL
Chol/HDL Ratio: 6.7 ratio — ABNORMAL HIGH (ref 0.0–5.0)
Cholesterol, Total: 249 mg/dL — ABNORMAL HIGH (ref 100–199)
HDL: 37 mg/dL — ABNORMAL LOW (ref >39)
LDL Chol Calc (NIH): 176 mg/dL — ABNORMAL HIGH (ref 0–99)
Triglycerides: 194 mg/dL — ABNORMAL HIGH (ref 0–149)
VLDL Cholesterol Cal: 36 mg/dL (ref 5–40)

## 2019-01-12 LAB — TSH: TSH: 3.08 u[IU]/mL (ref 0.450–4.500)

## 2019-01-12 LAB — HGB A1C W/O EAG: Hgb A1c MFr Bld: 6.9 % — ABNORMAL HIGH (ref 4.8–5.6)

## 2019-01-12 LAB — URIC ACID: Uric Acid: 5.6 mg/dL (ref 3.7–8.6)

## 2019-05-05 ENCOUNTER — Other Ambulatory Visit: Payer: 59

## 2019-05-10 ENCOUNTER — Other Ambulatory Visit: Payer: 59

## 2019-05-10 ENCOUNTER — Other Ambulatory Visit: Payer: Self-pay

## 2019-05-10 DIAGNOSIS — M109 Gout, unspecified: Secondary | ICD-10-CM

## 2019-05-10 DIAGNOSIS — E039 Hypothyroidism, unspecified: Secondary | ICD-10-CM

## 2019-05-10 DIAGNOSIS — E781 Pure hyperglyceridemia: Secondary | ICD-10-CM

## 2019-05-10 DIAGNOSIS — R739 Hyperglycemia, unspecified: Secondary | ICD-10-CM

## 2019-05-11 LAB — COMPREHENSIVE METABOLIC PANEL
ALT: 43 IU/L (ref 0–44)
AST: 62 IU/L — ABNORMAL HIGH (ref 0–40)
Albumin/Globulin Ratio: 1.4 (ref 1.2–2.2)
Albumin: 4.5 g/dL (ref 4.0–5.0)
Alkaline Phosphatase: 161 IU/L — ABNORMAL HIGH (ref 39–117)
BUN/Creatinine Ratio: 11 (ref 9–20)
BUN: 12 mg/dL (ref 6–24)
Bilirubin Total: 0.5 mg/dL (ref 0.0–1.2)
CO2: 22 mmol/L (ref 20–29)
Calcium: 9.6 mg/dL (ref 8.7–10.2)
Chloride: 102 mmol/L (ref 96–106)
Creatinine, Ser: 1.05 mg/dL (ref 0.76–1.27)
GFR calc Af Amer: 97 mL/min/{1.73_m2} (ref >59)
GFR calc non Af Amer: 84 mL/min/{1.73_m2} (ref >59)
Globulin, Total: 3.3 g/dL (ref 1.5–4.5)
Glucose: 249 mg/dL — ABNORMAL HIGH (ref 65–99)
Potassium: 4.3 mmol/L (ref 3.5–5.2)
Sodium: 141 mmol/L (ref 134–144)
Total Protein: 7.8 g/dL (ref 6.0–8.5)

## 2019-05-11 LAB — CBC WITH DIFFERENTIAL/PLATELET
Basophils Absolute: 0.1 10*3/uL (ref 0.0–0.2)
Basos: 1 %
EOS (ABSOLUTE): 0.3 10*3/uL (ref 0.0–0.4)
Eos: 3 %
Hematocrit: 46.7 % (ref 37.5–51.0)
Hemoglobin: 15.8 g/dL (ref 13.0–17.7)
Immature Grans (Abs): 0.1 10*3/uL (ref 0.0–0.1)
Immature Granulocytes: 1 %
Lymphocytes Absolute: 3.6 10*3/uL — ABNORMAL HIGH (ref 0.7–3.1)
Lymphs: 37 %
MCH: 29.2 pg (ref 26.6–33.0)
MCHC: 33.8 g/dL (ref 31.5–35.7)
MCV: 86 fL (ref 79–97)
Monocytes Absolute: 0.6 10*3/uL (ref 0.1–0.9)
Monocytes: 6 %
Neutrophils Absolute: 5.1 10*3/uL (ref 1.4–7.0)
Neutrophils: 52 %
Platelets: 366 10*3/uL (ref 150–450)
RBC: 5.41 x10E6/uL (ref 4.14–5.80)
RDW: 13.5 % (ref 11.6–15.4)
WBC: 9.7 10*3/uL (ref 3.4–10.8)

## 2019-05-11 LAB — URINALYSIS, ROUTINE W REFLEX MICROSCOPIC
Bilirubin, UA: NEGATIVE
Ketones, UA: NEGATIVE
Leukocytes,UA: NEGATIVE
Nitrite, UA: NEGATIVE
RBC, UA: NEGATIVE
Specific Gravity, UA: 1.025 (ref 1.005–1.030)
Urobilinogen, Ur: 0.2 mg/dL (ref 0.2–1.0)
pH, UA: 5.5 (ref 5.0–7.5)

## 2019-05-11 LAB — LIPID PANEL
Chol/HDL Ratio: 7.7 ratio — ABNORMAL HIGH (ref 0.0–5.0)
Cholesterol, Total: 232 mg/dL — ABNORMAL HIGH (ref 100–199)
HDL: 30 mg/dL — ABNORMAL LOW (ref >39)
LDL Chol Calc (NIH): 158 mg/dL — ABNORMAL HIGH (ref 0–99)
Triglycerides: 238 mg/dL — ABNORMAL HIGH (ref 0–149)
VLDL Cholesterol Cal: 44 mg/dL — ABNORMAL HIGH (ref 5–40)

## 2019-05-11 LAB — HGB A1C W/O EAG: Hgb A1c MFr Bld: 10 % — ABNORMAL HIGH (ref 4.8–5.6)

## 2019-05-11 LAB — URIC ACID: Uric Acid: 3.3 mg/dL — ABNORMAL LOW (ref 3.8–8.4)

## 2019-05-11 LAB — TSH: TSH: 4.3 u[IU]/mL (ref 0.450–4.500)

## 2019-08-31 ENCOUNTER — Other Ambulatory Visit: Payer: Self-pay

## 2019-08-31 ENCOUNTER — Ambulatory Visit: Payer: 59 | Admitting: Nurse Practitioner

## 2019-08-31 ENCOUNTER — Encounter: Payer: Self-pay | Admitting: Nurse Practitioner

## 2019-08-31 VITALS — BP 134/83 | HR 90 | Resp 20 | Ht 73.0 in | Wt 293.0 lb

## 2019-08-31 DIAGNOSIS — E781 Pure hyperglyceridemia: Secondary | ICD-10-CM

## 2019-08-31 DIAGNOSIS — M109 Gout, unspecified: Secondary | ICD-10-CM

## 2019-08-31 DIAGNOSIS — E118 Type 2 diabetes mellitus with unspecified complications: Secondary | ICD-10-CM

## 2019-08-31 DIAGNOSIS — Z125 Encounter for screening for malignant neoplasm of prostate: Secondary | ICD-10-CM

## 2019-08-31 DIAGNOSIS — G4733 Obstructive sleep apnea (adult) (pediatric): Secondary | ICD-10-CM

## 2019-08-31 DIAGNOSIS — Z008 Encounter for other general examination: Secondary | ICD-10-CM

## 2019-08-31 NOTE — Patient Instructions (Signed)

## 2019-08-31 NOTE — Progress Notes (Signed)
Subjective:    Patient ID: Robert Skinner, male    DOB: 12-Oct-1970, 49 y.o.   MRN: [id number]  HPI Robert Skinner is a 49 y.o. male who presents to the [facility] for his annual Biometric screening exam. Robert Skinner has worked for [employer] for 24 years and is currently working for [employer]. He enjoys his job. Robert Skinner reports being started on Metformin and starting a diet/exercise  Program. Today he states the only problem he has is with his left knee feeling a little sore. He thinks he may have twisted it.    PMH: hypothyroidism, gout, hyperglycemia, sleep apnea, Surgeries: none SH: no tobacco, ETOH or drug use  Medications: list reviewed and updated Allergies: Moxifloxacin, Penicillin  Immunizations: Elected not to get Covid vaccine, he did get influenza vaccine and is UTD on tetanus  Diet and exercise: Robert Skinner reports weight loss of over 20 pounds the past year.  Review of Systems  Musculoskeletal:       Left knee pain  All other systems reviewed and are negative.      Objective:   Physical Exam Constitutional:      General: He is not in acute distress.    Appearance: Normal appearance.  HENT:     Head: Normocephalic and atraumatic.     Right Ear: Tympanic membrane and ear canal normal.     Left Ear: Tympanic membrane normal.     Nose: Nose normal.     Mouth/Throat:     Mouth: Mucous membranes are moist.     Dentition: Normal dentition.     Pharynx: Oropharynx is clear.  Eyes:     General: Lids are normal.     Extraocular Movements: Extraocular movements intact.     Conjunctiva/sclera: Conjunctivae normal.  Neck:     Thyroid: No thyroid mass.     Trachea: Trachea normal.  Cardiovascular:     Rate and Rhythm: Normal rate and regular rhythm.  Pulmonary:     Effort: Pulmonary effort is normal.     Breath sounds: Normal breath sounds.  Musculoskeletal:     Cervical back: Normal range of motion and neck supple.     Left knee: No swelling. Normal  range of motion. Tenderness present over the LCL.       Legs:  Lymphadenopathy:     Cervical: No cervical adenopathy.  Skin:    General: Skin is warm and dry.  Neurological:     Mental Status: He is alert and oriented to person, place, and time.     Sensory: Sensation is intact.     Motor: Motor function is intact.     Coordination: Romberg sign negative.     Gait: Gait is intact.     Deep Tendon Reflexes:     Reflex Scores:      Bicep reflexes are 2+ on the right side and 2+ on the left side.      Brachioradialis reflexes are 2+ on the right side and 2+ on the left side.      Patellar reflexes are 2+ on the right side and 2+ on the left side.    Comments: Ambulatory with steady gait, stands on one foot without difficulty. Grips are equal. Radial pulses 2+.  Psychiatric:        Attention and Perception: Attention normal.        Mood and Affect: Mood normal.        Speech: Speech normal.  Behavior: Behavior normal.       Assessment & Plan:  49 y.o. male here for annual biometric screening exam. Limited exam normal with exception of tenderness of the left knee over the LCL. Discussed with the Robert Skinner Advil, ice, and rest for the left knee. If symptoms worsen we can reassess. Discussed healthy diet and exercise program. Robert Skinner given opportunity to ask questions. All questions answered and Robert Skinner voices understanding.

## 2019-09-01 LAB — LIPID PANEL
Chol/HDL Ratio: 6.1 ratio — ABNORMAL HIGH (ref 0.0–5.0)
Cholesterol, Total: 237 mg/dL — ABNORMAL HIGH (ref 100–199)
HDL: 39 mg/dL — ABNORMAL LOW (ref >39)
LDL Chol Calc (NIH): 172 mg/dL — ABNORMAL HIGH (ref 0–99)
Triglycerides: 142 mg/dL (ref 0–149)
VLDL Cholesterol Cal: 26 mg/dL (ref 5–40)

## 2019-09-01 LAB — COMPREHENSIVE METABOLIC PANEL
ALT: 33 IU/L (ref 0–44)
AST: 47 IU/L — ABNORMAL HIGH (ref 0–40)
Albumin/Globulin Ratio: 1.4 (ref 1.2–2.2)
Albumin: 4.4 g/dL (ref 4.0–5.0)
Alkaline Phosphatase: 113 IU/L (ref 48–121)
BUN/Creatinine Ratio: 12 (ref 9–20)
BUN: 13 mg/dL (ref 6–24)
Bilirubin Total: 0.5 mg/dL (ref 0.0–1.2)
CO2: 16 mmol/L — ABNORMAL LOW (ref 20–29)
Calcium: 9.6 mg/dL (ref 8.7–10.2)
Chloride: 104 mmol/L (ref 96–106)
Creatinine, Ser: 1.12 mg/dL (ref 0.76–1.27)
GFR calc Af Amer: 89 mL/min/{1.73_m2} (ref >59)
GFR calc non Af Amer: 77 mL/min/{1.73_m2} (ref >59)
Globulin, Total: 3.2 g/dL (ref 1.5–4.5)
Glucose: 103 mg/dL — ABNORMAL HIGH (ref 65–99)
Potassium: 4.4 mmol/L (ref 3.5–5.2)
Sodium: 143 mmol/L (ref 134–144)
Total Protein: 7.6 g/dL (ref 6.0–8.5)

## 2019-09-01 LAB — CBC WITH DIFFERENTIAL/PLATELET
Basophils Absolute: 0.1 10*3/uL (ref 0.0–0.2)
Basos: 1 %
EOS (ABSOLUTE): 0.2 10*3/uL (ref 0.0–0.4)
Eos: 2 %
Hematocrit: 42.9 % (ref 37.5–51.0)
Hemoglobin: 14.3 g/dL (ref 13.0–17.7)
Immature Grans (Abs): 0.1 10*3/uL (ref 0.0–0.1)
Immature Granulocytes: 1 %
Lymphocytes Absolute: 3.6 10*3/uL — ABNORMAL HIGH (ref 0.7–3.1)
Lymphs: 35 %
MCH: 28.1 pg (ref 26.6–33.0)
MCHC: 33.3 g/dL (ref 31.5–35.7)
MCV: 84 fL (ref 79–97)
Monocytes Absolute: 0.7 10*3/uL (ref 0.1–0.9)
Monocytes: 7 %
Neutrophils Absolute: 5.6 10*3/uL (ref 1.4–7.0)
Neutrophils: 54 %
Platelets: 373 10*3/uL (ref 150–450)
RBC: 5.08 x10E6/uL (ref 4.14–5.80)
RDW: 14 % (ref 11.6–15.4)
WBC: 10.2 10*3/uL (ref 3.4–10.8)

## 2019-09-01 LAB — URINALYSIS, ROUTINE W REFLEX MICROSCOPIC
Bilirubin, UA: NEGATIVE
Glucose, UA: NEGATIVE
Ketones, UA: NEGATIVE
Leukocytes,UA: NEGATIVE
Nitrite, UA: NEGATIVE
Protein,UA: NEGATIVE
RBC, UA: NEGATIVE
Specific Gravity, UA: 1.014 (ref 1.005–1.030)
Urobilinogen, Ur: 0.2 mg/dL (ref 0.2–1.0)
pH, UA: 6 (ref 5.0–7.5)

## 2019-09-01 LAB — URIC ACID: Uric Acid: 5.4 mg/dL (ref 3.8–8.4)

## 2019-09-01 LAB — MICROALBUMIN / CREATININE URINE RATIO
Creatinine, Urine: 90.6 mg/dL
Microalb/Creat Ratio: <3 mg/g creat (ref 0–29)
Microalbumin, Urine: <3 ug/mL

## 2019-09-01 LAB — TSH: TSH: 0.637 u[IU]/mL (ref 0.450–4.500)

## 2019-09-01 LAB — PSA: Prostate Specific Ag, Serum: 1 ng/mL (ref 0.0–4.0)

## 2019-09-01 LAB — HEMOGLOBIN A1C
Est. average glucose Bld gHb Est-mCnc: 137 mg/dL
Hgb A1c MFr Bld: 6.4 % — ABNORMAL HIGH (ref 4.8–5.6)

## 2020-03-15 ENCOUNTER — Other Ambulatory Visit: Payer: Self-pay

## 2020-03-15 ENCOUNTER — Other Ambulatory Visit: Payer: 59

## 2020-03-15 DIAGNOSIS — E781 Pure hyperglyceridemia: Secondary | ICD-10-CM

## 2020-03-15 DIAGNOSIS — G4733 Obstructive sleep apnea (adult) (pediatric): Secondary | ICD-10-CM

## 2020-03-15 DIAGNOSIS — M109 Gout, unspecified: Secondary | ICD-10-CM

## 2020-03-15 DIAGNOSIS — R7989 Other specified abnormal findings of blood chemistry: Secondary | ICD-10-CM

## 2020-03-15 DIAGNOSIS — E1110 Type 2 diabetes mellitus with ketoacidosis without coma: Secondary | ICD-10-CM

## 2020-03-16 LAB — CBC WITH DIFFERENTIAL/PLATELET
Basophils Absolute: 0.1 10*3/uL (ref 0.0–0.2)
Basos: 1 %
EOS (ABSOLUTE): 0.4 10*3/uL (ref 0.0–0.4)
Eos: 4 %
Hematocrit: 45.3 % (ref 37.5–51.0)
Hemoglobin: 15.2 g/dL (ref 13.0–17.7)
Immature Grans (Abs): 0 10*3/uL (ref 0.0–0.1)
Immature Granulocytes: 0 %
Lymphocytes Absolute: 4.2 10*3/uL — ABNORMAL HIGH (ref 0.7–3.1)
Lymphs: 36 %
MCH: 29.1 pg (ref 26.6–33.0)
MCHC: 33.6 g/dL (ref 31.5–35.7)
MCV: 87 fL (ref 79–97)
Monocytes Absolute: 0.7 10*3/uL (ref 0.1–0.9)
Monocytes: 6 %
Neutrophils Absolute: 6.1 10*3/uL (ref 1.4–7.0)
Neutrophils: 53 %
Platelets: 314 10*3/uL (ref 150–450)
RBC: 5.23 x10E6/uL (ref 4.14–5.80)
RDW: 14 % (ref 11.6–15.4)
WBC: 11.5 10*3/uL — ABNORMAL HIGH (ref 3.4–10.8)

## 2020-03-16 LAB — URINALYSIS, ROUTINE W REFLEX MICROSCOPIC
Bilirubin, UA: NEGATIVE
Glucose, UA: NEGATIVE
Ketones, UA: NEGATIVE
Leukocytes,UA: NEGATIVE
Nitrite, UA: NEGATIVE
Protein,UA: NEGATIVE
RBC, UA: NEGATIVE
Specific Gravity, UA: 1.018 (ref 1.005–1.030)
Urobilinogen, Ur: 0.2 mg/dL (ref 0.2–1.0)
pH, UA: 5.5 (ref 5.0–7.5)

## 2020-03-16 LAB — COMPREHENSIVE METABOLIC PANEL
ALT: 50 IU/L — ABNORMAL HIGH (ref 0–44)
AST: 51 IU/L — ABNORMAL HIGH (ref 0–40)
Albumin/Globulin Ratio: 1.9 (ref 1.2–2.2)
Albumin: 4.9 g/dL (ref 4.0–5.0)
Alkaline Phosphatase: 199 IU/L — ABNORMAL HIGH (ref 44–121)
BUN/Creatinine Ratio: 12 (ref 9–20)
BUN: 13 mg/dL (ref 6–24)
Bilirubin Total: 0.6 mg/dL (ref 0.0–1.2)
CO2: 25 mmol/L (ref 20–29)
Calcium: 10.1 mg/dL (ref 8.7–10.2)
Chloride: 103 mmol/L (ref 96–106)
Creatinine, Ser: 1.05 mg/dL (ref 0.76–1.27)
GFR calc Af Amer: 96 mL/min/{1.73_m2} (ref >59)
GFR calc non Af Amer: 83 mL/min/{1.73_m2} (ref >59)
Globulin, Total: 2.6 g/dL (ref 1.5–4.5)
Glucose: 140 mg/dL — ABNORMAL HIGH (ref 65–99)
Potassium: 4.5 mmol/L (ref 3.5–5.2)
Sodium: 142 mmol/L (ref 134–144)
Total Protein: 7.5 g/dL (ref 6.0–8.5)

## 2020-03-16 LAB — HGB A1C W/O EAG: Hgb A1c MFr Bld: 6.4 % — ABNORMAL HIGH (ref 4.8–5.6)

## 2020-03-16 LAB — LIPID PANEL
Chol/HDL Ratio: 4 ratio (ref 0.0–5.0)
Cholesterol, Total: 157 mg/dL (ref 100–199)
HDL: 39 mg/dL — ABNORMAL LOW (ref >39)
LDL Chol Calc (NIH): 90 mg/dL (ref 0–99)
Triglycerides: 161 mg/dL — ABNORMAL HIGH (ref 0–149)
VLDL Cholesterol Cal: 28 mg/dL (ref 5–40)

## 2020-03-16 LAB — URIC ACID: Uric Acid: 5.4 mg/dL (ref 3.8–8.4)

## 2020-05-02 ENCOUNTER — Ambulatory Visit: Payer: Self-pay | Admitting: General Surgery

## 2020-05-02 NOTE — H&P (View-Only) (Signed)
PATIENT PROFILE: Robert Skinner is a 50 y.o. male who presents to the Clinic for consultation at the request of Dr. Caryl Comes for evaluation of umbilical hernia.  PCP:  Cheryll Cockayne, MD  HISTORY OF PRESENT ILLNESS: Mr. Mcphearson reports he has been feeling an umbilical hernia since around 4 years ago.  He reported that initially it was not symptomatic but he has been getting more painful recently.  He reports that he had umbilical hernia repair in 2012.  In the last few months he has been getting more pain specially when pressure is applied or his heat on the umbilical area.  It is also getting bigger and more difficult to reduce.  He denies any abdominal distention nausea or vomiting.  Pain radiates around the periumbilical area.  There has been no alleviating factor identified.  Aggravating factor is applying pressure.  There has been no skin changes around umbilical hernia.  Denies any changes with bowel habits.   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) 07/23/3265   Hyperglycemia 07/10/2016   Sleep apnea, obstructive 06/23/2016   Overview    On CPAP      Arthritis due to gout 11/22/2013   Low serum testosterone level (Chronic) 11/18/2013   Acquired hypothyroidism, unspecified 11/18/2013   LFT elevation 11/18/2013   Overview    Ultrasound 5/18 reveals fat deposition in the liver without other significant abnormality      Hypertriglyceridemia 11/18/2013      GENERAL REVIEW OF SYSTEMS:   General ROS: negative for - chills, fatigue, fever, weight gain or weight loss Allergy and Immunology ROS: negative for - hives  Hematological and Lymphatic ROS: negative for - bleeding problems or bruising, negative for palpable nodes Endocrine ROS: negative for - heat or cold intolerance, hair changes Respiratory ROS: negative for - cough, shortness of breath or wheezing Cardiovascular ROS: no chest pain or palpitations GI  ROS: negative for nausea, vomiting, abdominal pain, diarrhea, constipation Musculoskeletal ROS: negative for - joint swelling or muscle pain Neurological ROS: negative for - confusion, syncope Dermatological ROS: negative for pruritus and rash Psychiatric: negative for anxiety, depression, difficulty sleeping and memory loss  MEDICATIONS: Current Outpatient Medications  Medication Sig Dispense Refill  . albuterol 90 mcg/actuation inhaler Inhale 2 inhalations into the lungs every 4 (four) hours as needed for Wheezing 1 each 1  . allopurinoL (ZYLOPRIM) 100 MG tablet TAKE 1 TABLET BY MOUTH EVERY DAY (Patient taking differently: Take 100 mg by mouth once daily Take with 300 mg  ) 90 tablet 2  . colchicine (COLCRYS) 0.6 mg tablet Take by mouth as needed    . cyclobenzaprine (FLEXERIL) 10 MG tablet Take 1 tablet (10 mg total) by mouth 2 (two) times daily as needed for Muscle spasms 40 tablet 11  . fluticasone propionate (FLONASE) 50 mcg/actuation nasal spray Place 2 sprays into both nostrils once daily 16 g 11  . indomethacin (INDOCIN) 50 MG capsule TAKE 1 CAPSULE BY MOUTH THREE TIMES A DAY AS NEEDED 30 capsule 0  . losartan (COZAAR) 50 MG tablet TAKE 1 TABLET BY MOUTH EVERY DAY 90 tablet 3  . metFORMIN (GLUCOPHAGE-XR) 500 MG XR tablet TAKE 2 TABLETS BY MOUTH DAILY WITH DINNER 180 tablet 3  . montelukast (SINGULAIR) 10 mg tablet Take 1 tablet (10 mg total) by mouth nightly 30 tablet 11  . pantoprazole (PROTONIX) 40 MG DR tablet  TAKE 1 TO 2 TABLETS BY MOUTH EVERY DAY FOR HEART BURN.**INS ONLY ALLOWS 1 TAB PER DAY** 90 tablet 3  . rosuvastatin (CRESTOR) 20 MG tablet Take 1 tablet (20 mg total) by mouth once daily 30 tablet 11  . allopurinoL (ZYLOPRIM) 300 MG tablet TAKE 1 TABLET BY MOUTH EVERY DAY 90 tablet 3  . azelastine (ASTELIN) 137 mcg nasal spray Place 1 spray into both nostrils 2 (two) times daily (Patient not taking: Reported on 05/01/2020  ) 30 mL 12  . levothyroxine (SYNTHROID) 150 MCG tablet  TAKE 1 TABLET (150 MCG TOTAL) BY MOUTH ONCE DAILY TAKE ON AN EMPTY STOMACH WITH A GLASS OF WATER AT LEAST 30-60 MINUTES BEFORE BREAKFAST. 90 tablet 3  . loratadine (CLARITIN) 10 mg tablet Take 10 mg by mouth once daily. (Patient not taking: Reported on 05/01/2020  )    . traZODone (DESYREL) 50 MG tablet Take 1 tablet (50 mg total) by mouth nightly (Patient not taking: Reported on 05/01/2020  ) 90 tablet 3   No current facility-administered medications for this visit.    ALLERGIES: Probenecid, Avelox [moxifloxacin], and Penicillin  PAST MEDICAL HISTORY: Past Medical History:  Diagnosis Date  . Chronic fatigue 11/18/2013  . Elevated LFTs   . Gout   . High blood pressure   . Hypertriglyceridemia   . Low serum testosterone level 11/18/2013  . Sleep apnea, obstructive 06/23/2016   On CPAP  . Thyroid disease     PAST SURGICAL HISTORY: Past Surgical History:  Procedure Laterality Date  . Nasal surgery    . UMBILICAL HERNIA REPAIR  07/2010   Dr Rochel Brome     FAMILY HISTORY: Family History  Problem Relation Age of Onset  . Migraines Mother   . Diabetes Mother      SOCIAL HISTORY: Social History   Socioeconomic History  . Marital status: Married    Spouse name: Not on file  . Number of children: Not on file  . Years of education: Not on file  . Highest education level: Not on file  Occupational History  . Not on file  Tobacco Use  . Smoking status: Never Smoker  . Smokeless tobacco: Never Used  Vaping Use  . Vaping Use: Never used  Substance and Sexual Activity  . Alcohol use: Yes    Alcohol/week: 0.0 standard drinks  . Drug use: Never  . Sexual activity: Defer  Other Topics Concern  . Not on file  Social History Narrative   Education: Secretary/administrator   Occupation: Freight forwarder   Hobbies: sports,softball, basketball   Marital Status: married   Social Determinants of Radio broadcast assistant Strain: Not on file  Food Insecurity: Not on file  Transportation Needs: Not on  file    PHYSICAL EXAM: Vitals:   05/01/20 0824  BP: (!) 161/85  Pulse: 72   Body mass index is 39.98 kg/m. Weight: (!) 137.4 kg (303 lb)   GENERAL: Alert, active, oriented x3  HEENT: Pupils equal reactive to light. Extraocular movements are intact. Sclera clear. Palpebral conjunctiva normal red color.Pharynx clear.  NECK: Supple with no palpable mass and no adenopathy.  LUNGS: Sound clear with no rales rhonchi or wheezes.  HEART: Regular rhythm S1 and S2 without murmur.  ABDOMEN: Soft and depressible, nontender with no palpable mass, no hepatomegaly. Palpable umbilical hernia, partially reducible but not able to complete reduced due to pain.  EXTREMITIES: Well-developed well-nourished symmetrical with no dependent edema.  NEUROLOGICAL: Awake alert oriented, facial expression symmetrical, moving  all extremities.  REVIEW OF DATA: I have reviewed the following data today: No visits with results within 3 Month(s) from this visit.  Latest known visit with results is:  Office Visit on 12/25/2016  Component Date Value  . Color 12/25/2016 Yellow   . Clarity 12/25/2016 Clear   . Specific Gravity 12/25/2016 1.010   . pH, Urine 12/25/2016 5.0   . Protein, Urinalysis 12/25/2016 Negative   . Glucose, Urinalysis 12/25/2016 Negative   . Ketones, Urinalysis 12/25/2016 Negative   . Blood, Urinalysis 12/25/2016 Negative   . Nitrite, Urinalysis 12/25/2016 Negative   . Leukocyte Esterase, Urin* 12/25/2016 Negative   . White Blood Cells, Urina* 12/25/2016 None Seen   . Red Blood Cells, Urinaly* 12/25/2016 None Seen   . Bacteria, Urinalysis 12/25/2016 None Seen   . Squamous Epithelial Cell* 12/25/2016 None Seen      ASSESSMENT: Mr. Vest is a 50 y.o. male presenting for consultation for umbilical hernia.    The patient presents with a symptomatic, partially reducible and painful umbilical hernia. Patient was oriented about the diagnosis of umbilical hernia and its implication. The  patient was oriented about the treatment alternatives (observation vs surgical repair). Due to patient symptoms, repair is recommended. Patient oriented about the surgical procedure, the use of mesh and its risk of complications such as: infection, bleeding, injury to bowel, vasculature or bladder, risk of recurrence and chronic pain.   Since he is getting more pain from the hernia and this is a recurrence of an umbilical hernia I recommended to proceed with surgical management.  Patient agreed.  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 Robotic assisted laparoscopic recurrent umbilical hernia repair with mesh 2. CBC, CMP done on 03/15/20 3. Avoid aspirin 5 days before surgery 4. Contact us if you have any concern.   Patient and his wife verbalized understanding, all questions were answered, and were agreeable with the plan outlined above.    Herbert Pun, MD  Electronically signed by Herbert Pun, MD

## 2020-05-02 NOTE — H&P (Signed)
PATIENT PROFILE: Robert Skinner is a 50 y.o. male who presents to the Clinic for consultation at the request of Dr. Caryl Comes for evaluation of umbilical hernia.  PCP:  Cheryll Cockayne, MD  HISTORY OF PRESENT ILLNESS: Robert Skinner reports he has been feeling an umbilical hernia since around 4 years ago.  He reported that initially it was not symptomatic but he has been getting more painful recently.  He reports that he had umbilical hernia repair in 2012.  In the last few months he has been getting more pain specially when pressure is applied or his heat on the umbilical area.  It is also getting bigger and more difficult to reduce.  He denies any abdominal distention nausea or vomiting.  Pain radiates around the periumbilical area.  There has been no alleviating factor identified.  Aggravating factor is applying pressure.  There has been no skin changes around umbilical hernia.  Denies any changes with bowel habits.   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/2013   Low serum testosterone level (Chronic) 11/18/2013   Acquired hypothyroidism, unspecified 11/18/2013   LFT elevation 11/18/2013   Overview    Ultrasound 5/18 reveals fat deposition in the liver without other significant abnormality      Hypertriglyceridemia 11/18/2013      GENERAL REVIEW OF SYSTEMS:   General ROS: negative for - chills, fatigue, fever, weight gain or weight loss Allergy and Immunology ROS: negative for - hives  Hematological and Lymphatic ROS: negative for - bleeding problems or bruising, negative for palpable nodes Endocrine ROS: negative for - heat or cold intolerance, hair changes Respiratory ROS: negative for - cough, shortness of breath or wheezing Cardiovascular ROS: no chest pain or palpitations GI  ROS: negative for nausea, vomiting, abdominal pain, diarrhea, constipation Musculoskeletal ROS: negative for - joint swelling or muscle pain Neurological ROS: negative for - confusion, syncope Dermatological ROS: negative for pruritus and rash Psychiatric: negative for anxiety, depression, difficulty sleeping and memory loss  MEDICATIONS: Current Outpatient Medications  Medication Sig Dispense Refill  . albuterol 90 mcg/actuation inhaler Inhale 2 inhalations into the lungs every 4 (four) hours as needed for Wheezing 1 each 1  . allopurinoL (ZYLOPRIM) 100 MG tablet TAKE 1 TABLET BY MOUTH EVERY DAY (Patient taking differently: Take 100 mg by mouth once daily Take with 300 mg  ) 90 tablet 2  . colchicine (COLCRYS) 0.6 mg tablet Take by mouth as needed    . cyclobenzaprine (FLEXERIL) 10 MG tablet Take 1 tablet (10 mg total) by mouth 2 (two) times daily as needed for Muscle spasms 40 tablet 11  . fluticasone propionate (FLONASE) 50 mcg/actuation nasal spray Place 2 sprays into both nostrils once daily 16 g 11  . indomethacin (INDOCIN) 50 MG capsule TAKE 1 CAPSULE BY MOUTH THREE TIMES A DAY AS NEEDED 30 capsule 0  . losartan (COZAAR) 50 MG tablet TAKE 1 TABLET BY MOUTH EVERY DAY 90 tablet 3  . metFORMIN (GLUCOPHAGE-XR) 500 MG XR tablet TAKE 2 TABLETS BY MOUTH DAILY WITH DINNER 180 tablet 3  . montelukast (SINGULAIR) 10 mg tablet Take 1 tablet (10 mg total) by mouth nightly 30 tablet 11  . pantoprazole (PROTONIX) 40 MG DR tablet  TAKE 1 TO 2 TABLETS BY MOUTH EVERY DAY FOR HEART BURN.**INS ONLY ALLOWS 1 TAB PER DAY** 90 tablet 3  . rosuvastatin (CRESTOR) 20 MG tablet Take 1 tablet (20 mg total) by mouth once daily 30 tablet 11  . allopurinoL (ZYLOPRIM) 300 MG tablet TAKE 1 TABLET BY MOUTH EVERY DAY 90 tablet 3  . azelastine (ASTELIN) 137 mcg nasal spray Place 1 spray into both nostrils 2 (two) times daily (Patient not taking: Reported on 05/01/2020  ) 30 mL 12  . levothyroxine (SYNTHROID) 150 MCG tablet  TAKE 1 TABLET (150 MCG TOTAL) BY MOUTH ONCE DAILY TAKE ON AN EMPTY STOMACH WITH A GLASS OF WATER AT LEAST 30-60 MINUTES BEFORE BREAKFAST. 90 tablet 3  . loratadine (CLARITIN) 10 mg tablet Take 10 mg by mouth once daily. (Patient not taking: Reported on 05/01/2020  )    . traZODone (DESYREL) 50 MG tablet Take 1 tablet (50 mg total) by mouth nightly (Patient not taking: Reported on 05/01/2020  ) 90 tablet 3   No current facility-administered medications for this visit.    ALLERGIES: Probenecid, Avelox [moxifloxacin], and Penicillin  PAST MEDICAL HISTORY: Past Medical History:  Diagnosis Date  . Chronic fatigue 11/18/2013  . Elevated LFTs   . Gout   . High blood pressure   . Hypertriglyceridemia   . Low serum testosterone level 11/18/2013  . Sleep apnea, obstructive 06/23/2016   On CPAP  . Thyroid disease     PAST SURGICAL HISTORY: Past Surgical History:  Procedure Laterality Date  . Nasal surgery    . UMBILICAL HERNIA REPAIR  07/2010   Dr Rochel Brome     FAMILY HISTORY: Family History  Problem Relation Age of Onset  . Migraines Mother   . Diabetes Mother      SOCIAL HISTORY: Social History   Socioeconomic History  . Marital status: Married    Spouse name: Not on file  . Number of children: Not on file  . Years of education: Not on file  . Highest education level: Not on file  Occupational History  . Not on file  Tobacco Use  . Smoking status: Never Smoker  . Smokeless tobacco: Never Used  Vaping Use  . Vaping Use: Never used  Substance and Sexual Activity  . Alcohol use: Yes    Alcohol/week: 0.0 standard drinks  . Drug use: Never  . Sexual activity: Defer  Other Topics Concern  . Not on file  Social History Narrative   Education: Secretary/administrator   Occupation: Freight forwarder   Hobbies: sports,softball, basketball   Marital Status: married   Social Determinants of Radio broadcast assistant Strain: Not on file  Food Insecurity: Not on file  Transportation Needs: Not on  file    PHYSICAL EXAM: Vitals:   05/01/20 0824  BP: (!) 161/85  Pulse: 72   Body mass index is 39.98 kg/m. Weight: (!) 137.4 kg (303 lb)   GENERAL: Alert, active, oriented x3  HEENT: Pupils equal reactive to light. Extraocular movements are intact. Sclera clear. Palpebral conjunctiva normal red color.Pharynx clear.  NECK: Supple with no palpable mass and no adenopathy.  LUNGS: Sound clear with no rales rhonchi or wheezes.  HEART: Regular rhythm S1 and S2 without murmur.  ABDOMEN: Soft and depressible, nontender with no palpable mass, no hepatomegaly. Palpable umbilical hernia, partially reducible but not able to complete reduced due to pain.  EXTREMITIES: Well-developed well-nourished symmetrical with no dependent edema.  NEUROLOGICAL: Awake alert oriented, facial expression symmetrical, moving  all extremities.  REVIEW OF DATA: I have reviewed the following data today: No visits with results within 3 Month(s) from this visit.  Latest known visit with results is:  Office Visit on 12/25/2016  Component Date Value  . Color 12/25/2016 Yellow   . Clarity 12/25/2016 Clear   . Specific Gravity 12/25/2016 1.010   . pH, Urine 12/25/2016 5.0   . Protein, Urinalysis 12/25/2016 Negative   . Glucose, Urinalysis 12/25/2016 Negative   . Ketones, Urinalysis 12/25/2016 Negative   . Blood, Urinalysis 12/25/2016 Negative   . Nitrite, Urinalysis 12/25/2016 Negative   . Leukocyte Esterase, Urin* 12/25/2016 Negative   . White Blood Cells, Urina* 12/25/2016 None Seen   . Red Blood Cells, Urinaly* 12/25/2016 None Seen   . Bacteria, Urinalysis 12/25/2016 None Seen   . Squamous Epithelial Cell* 12/25/2016 None Seen      ASSESSMENT: Mr. Kinn is a 50 y.o. male presenting for consultation for umbilical hernia.    The patient presents with a symptomatic, partially reducible and painful umbilical hernia. Patient was oriented about the diagnosis of umbilical hernia and its implication. The  patient was oriented about the treatment alternatives (observation vs surgical repair). Due to patient symptoms, repair is recommended. Patient oriented about the surgical procedure, the use of mesh and its risk of complications such as: infection, bleeding, injury to bowel, vasculature or bladder, risk of recurrence and chronic pain.   Since he is getting more pain from the hernia and this is a recurrence of an umbilical hernia I recommended to proceed with surgical management.  Patient agreed.  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 Robotic assisted laparoscopic recurrent umbilical hernia repair with mesh 2. CBC, CMP done on 03/15/20 3. Avoid aspirin 5 days before surgery 4. Contact us if you have any concern.   Patient and his wife verbalized understanding, all questions were answered, and were agreeable with the plan outlined above.    Herbert Pun, MD  Electronically signed by Herbert Pun, MD

## 2020-05-09 ENCOUNTER — Other Ambulatory Visit
Admission: RE | Admit: 2020-05-09 | Discharge: 2020-05-09 | Disposition: A | Discharge status: Home / Self Care | Payer: Managed Care, Other (non HMO) | Source: Ambulatory Visit | Attending: General Surgery | Admitting: General Surgery

## 2020-05-09 ENCOUNTER — Other Ambulatory Visit: Payer: Self-pay

## 2020-05-09 HISTORY — DX: Sleep apnea, unspecified: G47.30

## 2020-05-09 HISTORY — DX: Unspecified asthma, uncomplicated: J45.909

## 2020-05-09 HISTORY — DX: Hypothyroidism, unspecified: E03.9

## 2020-05-09 HISTORY — DX: Type 2 diabetes mellitus without complications: E11.9

## 2020-05-09 HISTORY — DX: Gastro-esophageal reflux disease without esophagitis: K21.9

## 2020-05-09 HISTORY — DX: Pure hyperglyceridemia: E78.1

## 2020-05-09 HISTORY — DX: Gout, unspecified: M10.9

## 2020-05-09 NOTE — Patient Instructions (Addendum)
Your procedure is scheduled on:  Wednesday, March 2 Report to the Registration Desk on the 1st floor of the [facility]. To find out your arrival time, please call [phone] between 1PM - 3PM on: Tuesday, March 1  REMEMBER: Instructions that are not followed completely may result in serious medical risk, up to and including death; or upon the discretion of your surgeon and anesthesiologist your surgery may need to be rescheduled.  Do not eat food after midnight the night before surgery.  No gum chewing, lozengers or hard candies.  You may however, drink water up to 2 hours before you are scheduled to arrive for your surgery. Do not drink anything within 2 hours of your scheduled arrival time.  TAKE THESE MEDICATIONS THE MORNING OF SURGERY WITH A SIP OF WATER:  1.  Albuterol inhaler 2.  Levothyroxine 3.  Pantoprazole - (take one the night before and one on the morning of surgery - helps to prevent nausea after surgery.)  Use inhalers on the day of surgery and bring to the hospital.  Stop Metformin 2 days prior to surgery. Last day to take is Sunday, February 27; resume after surgery.  One week prior to surgery: starting February 23 Stop Anti-inflammatories (NSAIDS) such as Advil, Aleve, Ibuprofen, Motrin, Naproxen, Naprosyn and Aspirin based products such as Excedrin, Goodys Powder, BC Powder. Stop ANY OVER THE COUNTER supplements until after surgery.  No Alcohol for 24 hours before or after surgery.  No Smoking including e-cigarettes for 24 hours prior to surgery.  No chewable tobacco products for at least 6 hours prior to surgery.  No nicotine patches on the day of surgery.  Do not use any "recreational" drugs for at least a week prior to your surgery.  Please be advised that the combination of cocaine and anesthesia may have negative outcomes, up to and including death. If you test positive for cocaine, your surgery will be cancelled.  On the morning of surgery brush your  teeth with toothpaste and water, you may rinse your mouth with mouthwash if you wish. Do not swallow any toothpaste or mouthwash.  Do not wear jewelry, make-up, hairpins, clips or nail polish.  Do not wear lotions, powders, or perfumes.   Do not shave body from the neck down 48 hours prior to surgery just in case you cut yourself which could leave a site for infection.  Also, freshly shaved skin may become irritated if using the CHG soap.  Contact lenses, hearing aids and dentures may not be worn into surgery.  Do not bring valuables to the hospital. [facility] is not responsible for any missing/lost belongings or valuables.   Use CHG Soap as directed on instruction sheet.  Bring your C-PAP to the hospital with you in case you may have to spend the night.   Notify your doctor if there is any change in your medical condition (cold, fever, infection).  Wear comfortable clothing (specific to your surgery type) to the hospital.  Plan for stool softeners for home use; pain medications have a tendency to cause constipation. You can also help prevent constipation by eating foods high in fiber such as fruits and vegetables and drinking plenty of fluids as your diet allows.  After surgery, you can help prevent lung complications by doing breathing exercises.  Take deep breaths and cough every 1-2 hours. Your doctor may order a device called an Incentive Spirometer to help you take deep breaths. When coughing or sneezing, hold a pillow firmly against  your incision with both hands. This is called "splinting." Doing this helps protect your incision. It also decreases belly discomfort.  If you are being discharged the day of surgery, you will not be allowed to drive home. You will need a responsible adult (18 years or older) to drive you home and stay with you that night.   If you are taking public transportation, you will need to have a responsible adult (18 years or older) with you. Please  confirm with your physician that it is acceptable to use public transportation.   Please call the [place] Dept. at [phone] if you have any questions about these instructions.  Visitation Policy:  Patients undergoing a surgery or procedure may have one family member or support person with them as long as that person is not COVID-19 positive or experiencing its symptoms.  That person may remain in the waiting area during the procedure.

## 2020-05-09 NOTE — Pre-Procedure Instructions (Signed)
Telephone encounter documentation in care everywhere states the patient had positive covid test result on 04/05/20. Verified with patient. Per policy, patient does not need a repeat covid test prior to upcoming surgery on 05/16/20.

## 2020-05-09 NOTE — Progress Notes (Signed)
Perioperative Services Pre-Admission/Anesthesia Testing   Date: 05/09/20 Name: Robert Skinner MRN:   [id number]  Re: Consideration of preoperative prophylactic antibiotic change   Request sent to: Herbert Pun, MD (routed and/or faxed via [facility])  Planned Surgical Procedure(s):    Case: [id number] Date/Time: 05/16/20 0815   Procedure: XI ROBOT ASSISTED UMBILICAL HERNIA REPAIR (N/A Abdomen)   Anesthesia type: General   Pre-op diagnosis: G64.4 Recurrent umbilical hernia w/ incaceration   Location: [place] 04 / [place]   Surgeons: Herbert Pun, MD    Notes: 1. Patient has a documented allergy to PCN  . Advising that PCN has caused him to experience low severity rash in the past.   2. Received PCN/cephalosporin with no documented complications . CEFDINIR received on 05/08/2016  3. Screened as appropriate for cephalosporin use during medication reconciliation . No immediate angioedema, dysphagia, SOB, anaphylaxis symptoms. . No severe rash involving mucous membranes or skin necrosis. . No hospital admissions related to side effects of PCN/cephalosporin use.  . No documented reaction to PCN or cephalosporin in the last 10 years.  Request:  As an evidence based approach to reducing the rate of incidence for post-operative SSI and the development of MDROs, could an agent with narrower coverage for preoperative prophylaxis in this patient's upcoming surgical course be considered?   1. Currently ordered preoperative prophylactic ABX: clindamycin.   2. Specifically requesting change to cephalosporin (CEFAZOLIN).   3. Please communicate decision with me and I will change the orders in Epic as per your direction.   Things to consider:  Many patients report that they were "allergic" to PCN earlier in life, however this does not translate into a true lifelong allergy. Patients can lose sensitivity to specific IgE antibodies over time if PCN is  avoided (Kleris & Lugar, 2019).   Up to 10% of the adult population and 15% of hospitalized patients report an allergy to PCN, however clinical studies suggest that 90% of those reporting an allergy can tolerate PCN antibiotics (Kleris & Lugar, 2019).   Cross-sensitivity between PCN and cephalosporins has been documented as being as high as 10%, however this estimation included data believed to have been collected in a setting where there was contamination. Newer data suggests that the prevalence of cross-sensitivity between PCN and cephalosporins is actually estimated to be closer to 1% (Hermanides et al., 2018).    Patients labeled as PCN allergic, whether they are truly allergic or not, have been found to have inferior outcomes in terms of rates of serious infection, and these patients tend to have longer hospital stays (Weed, 2019).   Treatment related secondary infections, such as Clostridioides difficile, have been linked to the improper use of broad spectrum antibiotics in patients improperly labeled as PCN allergic (Kleris & Lugar, 2019).   Anaphylaxis from cephalosporins is rare and the evidence suggests that there is no increased risk of an anaphylactic type reaction when cephalosporins are used in a PCN allergic patient (Pichichero, 2006).  Citations: Hermanides J, Lemkes BA, Prins Pearla Dubonnet MW, Terreehorst I. Presumed ?-Lactam Allergy and Cross-reactivity in the Operating Theater: A Practical Approach. Anesthesiology. 2018 Aug;129(2):335-342. doi: 10.1097/ALN.0000000000002252. PMID: 03474259.  Kleris, [place] & Lugar, P. L. (2019). Things We Do For No Reason: Failing to Question a Penicillin Allergy History. Journal of hospital medicine, 14(10), [phone]. Advance online publication. https://www.wallace-middleton.info/  Pichichero, M. E. (2006). Cephalosporins can be prescribed safely for penicillin-allergic patients. Journal of family medicine, 55(2), 106-112. Accessed:  https://cdn.mdedge.com/files/s87fs-public/Document/September-2017/5502JFP_AppliedEvidence1.pdf   Honor Loh, MSN, APRN, FNP-C, CEN [facility]  Peri-operative Services Nurse Practitioner FAX: [phone] 05/09/20 1:56 PM

## 2020-05-10 ENCOUNTER — Encounter
Admission: RE | Admit: 2020-05-10 | Discharge: 2020-05-10 | Disposition: A | Discharge status: Home / Self Care | Payer: Managed Care, Other (non HMO) | Source: Ambulatory Visit | Attending: General Surgery | Admitting: General Surgery

## 2020-05-10 DIAGNOSIS — I10 Essential (primary) hypertension: Secondary | ICD-10-CM | POA: Insufficient documentation

## 2020-05-10 DIAGNOSIS — E119 Type 2 diabetes mellitus without complications: Secondary | ICD-10-CM | POA: Insufficient documentation

## 2020-05-10 DIAGNOSIS — Z01810 Encounter for preprocedural cardiovascular examination: Secondary | ICD-10-CM | POA: Diagnosis present

## 2020-05-11 NOTE — Progress Notes (Signed)
  Perioperative Services Pre-Admission/Anesthesia Testing     Date: 05/11/20  Name: Robert Skinner MRN:   [id number]  Re: Change in [place] for upcoming surgery   Case: [id number] Date/Time: 05/16/20 0815   Procedure: XI ROBOT ASSISTED UMBILICAL HERNIA REPAIR (N/A Abdomen)   Anesthesia type: General   Pre-op diagnosis: I47.9 Recurrent umbilical hernia w/ incaceration   Location: [place] 04 / [place] [facility]   Surgeons: Herbert Pun, MD    Primary attending surgeon was consulted regarding consideration of therapeutic change in antimicrobial agent being used for preoperative prophylaxis in this patient's upcoming surgical case. Following analysis of the risk versus benefits, Dr. Herbert Pun, MD advising that it would be acceptable to discontinue the ordered clindamycin and place an order for cefazolin 2 gm IV on call to the OR. Orders for this patient were amended by me following collaborative conversation with attending surgeon.  Honor Loh, MSN, APRN, FNP-C, CEN [facility]  Peri-operative Services Nurse Practitioner Phone: [phone] 05/11/20 8:27 AM

## 2020-05-14 ENCOUNTER — Other Ambulatory Visit: Payer: Managed Care, Other (non HMO)

## 2020-05-16 ENCOUNTER — Encounter
Admission: RE | Disposition: A | Discharge status: Home / Self Care | Payer: Self-pay | Source: Home / Self Care | Attending: General Surgery

## 2020-05-16 ENCOUNTER — Ambulatory Visit
Admission: RE | Admit: 2020-05-16 | Discharge: 2020-05-16 | Disposition: A | Discharge status: Home / Self Care | Payer: Managed Care, Other (non HMO) | Attending: General Surgery | Admitting: General Surgery

## 2020-05-16 ENCOUNTER — Encounter: Payer: Self-pay | Admitting: General Surgery

## 2020-05-16 ENCOUNTER — Other Ambulatory Visit: Payer: Self-pay

## 2020-05-16 ENCOUNTER — Ambulatory Visit: Payer: Managed Care, Other (non HMO) | Admitting: Urgent Care

## 2020-05-16 DIAGNOSIS — G4733 Obstructive sleep apnea (adult) (pediatric): Secondary | ICD-10-CM | POA: Diagnosis not present

## 2020-05-16 DIAGNOSIS — Z888 Allergy status to other drugs, medicaments and biological substances status: Secondary | ICD-10-CM | POA: Insufficient documentation

## 2020-05-16 DIAGNOSIS — Z880 Allergy status to penicillin: Secondary | ICD-10-CM | POA: Diagnosis not present

## 2020-05-16 DIAGNOSIS — E119 Type 2 diabetes mellitus without complications: Secondary | ICD-10-CM | POA: Diagnosis not present

## 2020-05-16 DIAGNOSIS — E781 Pure hyperglyceridemia: Secondary | ICD-10-CM | POA: Diagnosis not present

## 2020-05-16 DIAGNOSIS — Z833 Family history of diabetes mellitus: Secondary | ICD-10-CM | POA: Insufficient documentation

## 2020-05-16 DIAGNOSIS — K420 Umbilical hernia with obstruction, without gangrene: Secondary | ICD-10-CM | POA: Diagnosis present

## 2020-05-16 DIAGNOSIS — Z7984 Long term (current) use of oral hypoglycemic drugs: Secondary | ICD-10-CM | POA: Insufficient documentation

## 2020-05-16 DIAGNOSIS — Z79890 Hormone replacement therapy (postmenopausal): Secondary | ICD-10-CM | POA: Insufficient documentation

## 2020-05-16 DIAGNOSIS — Z79899 Other long term (current) drug therapy: Secondary | ICD-10-CM | POA: Insufficient documentation

## 2020-05-16 LAB — GLUCOSE, CAPILLARY
Glucose-Capillary: 146 mg/dL — ABNORMAL HIGH (ref 70–99)
Glucose-Capillary: 163 mg/dL — ABNORMAL HIGH (ref 70–99)

## 2020-05-16 SURGERY — REPAIR, HERNIA, UMBILICAL, ROBOT-ASSISTED
Anesthesia: General | Site: Abdomen

## 2020-05-16 MED ORDER — OXYCODONE HCL 5 MG PO TABS
ORAL_TABLET | ORAL | Status: AC
  Administered 2020-05-16: 5 mg via ORAL
  Filled 2020-05-16: qty 1

## 2020-05-16 MED ORDER — FENTANYL CITRATE (PF) 100 MCG/2ML IJ SOLN
INTRAMUSCULAR | Status: AC
  Filled 2020-05-16: qty 2

## 2020-05-16 MED ORDER — CHLORHEXIDINE GLUCONATE 0.12 % MT SOLN
OROMUCOSAL | Status: AC
  Administered 2020-05-16: 15 mL via OROMUCOSAL
  Filled 2020-05-16: qty 15

## 2020-05-16 MED ORDER — ROCURONIUM BROMIDE 100 MG/10ML IV SOLN
INTRAVENOUS | Status: DC | PRN
  Administered 2020-05-16: 100 mg via INTRAVENOUS

## 2020-05-16 MED ORDER — OXYCODONE HCL 5 MG/5ML PO SOLN
5.0000 mg | Freq: Once | ORAL | Status: AC | PRN
Start: 2020-05-16 — End: 2020-05-16

## 2020-05-16 MED ORDER — OXYCODONE HCL 5 MG PO TABS
5.0000 mg | ORAL_TABLET | Freq: Once | ORAL | Status: AC | PRN
Start: 2020-05-16 — End: 2020-05-16

## 2020-05-16 MED ORDER — GLYCOPYRROLATE 0.2 MG/ML IJ SOLN
INTRAMUSCULAR | Status: DC | PRN
  Administered 2020-05-16: .2 mg via INTRAVENOUS

## 2020-05-16 MED ORDER — SUGAMMADEX SODIUM 500 MG/5ML IV SOLN
INTRAVENOUS | Status: AC
  Filled 2020-05-16: qty 5

## 2020-05-16 MED ORDER — PROPOFOL 10 MG/ML IV BOLUS
INTRAVENOUS | Status: AC
  Filled 2020-05-16: qty 20

## 2020-05-16 MED ORDER — CEFAZOLIN SODIUM-DEXTROSE 2-4 GM/100ML-% IV SOLN
2.0000 g | Freq: Once | INTRAVENOUS | Status: AC
  Administered 2020-05-16: 2 g via INTRAVENOUS

## 2020-05-16 MED ORDER — SODIUM CHLORIDE 0.9 % IV SOLN: INTRAVENOUS | Status: DC

## 2020-05-16 MED ORDER — ONDANSETRON HCL 4 MG/2ML IJ SOLN
INTRAMUSCULAR | Status: AC
  Filled 2020-05-16: qty 2

## 2020-05-16 MED ORDER — CHLORHEXIDINE GLUCONATE 0.12 % MT SOLN: 15.0000 mL | Freq: Once | OROMUCOSAL | Status: AC

## 2020-05-16 MED ORDER — DEXMEDETOMIDINE (PRECEDEX) IN NS 20 MCG/5ML (4 MCG/ML) IV SYRINGE
PREFILLED_SYRINGE | INTRAVENOUS | Status: DC | PRN
  Administered 2020-05-16: 12 ug via INTRAVENOUS
  Administered 2020-05-16: 8 ug via INTRAVENOUS

## 2020-05-16 MED ORDER — GLYCOPYRROLATE 0.2 MG/ML IJ SOLN
INTRAMUSCULAR | Status: AC
  Filled 2020-05-16: qty 1

## 2020-05-16 MED ORDER — PROPOFOL 10 MG/ML IV BOLUS
INTRAVENOUS | Status: DC | PRN
  Administered 2020-05-16: 200 mg via INTRAVENOUS

## 2020-05-16 MED ORDER — DEXMEDETOMIDINE (PRECEDEX) IN NS 20 MCG/5ML (4 MCG/ML) IV SYRINGE
PREFILLED_SYRINGE | INTRAVENOUS | Status: AC
  Filled 2020-05-16: qty 5

## 2020-05-16 MED ORDER — ACETAMINOPHEN 10 MG/ML IV SOLN
INTRAVENOUS | Status: AC
  Filled 2020-05-16: qty 100

## 2020-05-16 MED ORDER — HYDROCODONE-ACETAMINOPHEN 5-325 MG PO TABS: 1.0000 | ORAL_TABLET | ORAL | 0 refills | Status: AC | PRN

## 2020-05-16 MED ORDER — SEVOFLURANE IN SOLN
RESPIRATORY_TRACT | Status: AC
  Filled 2020-05-16: qty 250

## 2020-05-16 MED ORDER — DEXAMETHASONE SODIUM PHOSPHATE 10 MG/ML IJ SOLN
INTRAMUSCULAR | Status: DC | PRN
  Administered 2020-05-16: 10 mg via INTRAVENOUS

## 2020-05-16 MED ORDER — SUGAMMADEX SODIUM 200 MG/2ML IV SOLN
INTRAVENOUS | Status: DC | PRN
  Administered 2020-05-16: 200 mg via INTRAVENOUS

## 2020-05-16 MED ORDER — HYDROMORPHONE HCL 1 MG/ML IJ SOLN
INTRAMUSCULAR | Status: AC
  Administered 2020-05-16: 0.5 mg via INTRAVENOUS
  Filled 2020-05-16: qty 1

## 2020-05-16 MED ORDER — BUPIVACAINE-EPINEPHRINE (PF) 0.25% -1:200000 IJ SOLN
INTRAMUSCULAR | Status: DC | PRN
  Administered 2020-05-16: 30 mL

## 2020-05-16 MED ORDER — ORAL CARE MOUTH RINSE: 15.0000 mL | Freq: Once | OROMUCOSAL | Status: AC

## 2020-05-16 MED ORDER — CEFAZOLIN SODIUM-DEXTROSE 2-4 GM/100ML-% IV SOLN
INTRAVENOUS | Status: AC
  Filled 2020-05-16: qty 100

## 2020-05-16 MED ORDER — ROCURONIUM BROMIDE 10 MG/ML (PF) SYRINGE
PREFILLED_SYRINGE | INTRAVENOUS | Status: AC
  Filled 2020-05-16: qty 10

## 2020-05-16 MED ORDER — PHENYLEPHRINE HCL (PRESSORS) 10 MG/ML IV SOLN
INTRAVENOUS | Status: DC | PRN
  Administered 2020-05-16: 100 ug via INTRAVENOUS
  Administered 2020-05-16: 200 ug via INTRAVENOUS
  Administered 2020-05-16 (×2): 100 ug via INTRAVENOUS

## 2020-05-16 MED ORDER — LIDOCAINE HCL (CARDIAC) PF 100 MG/5ML IV SOSY
PREFILLED_SYRINGE | INTRAVENOUS | Status: DC | PRN
  Administered 2020-05-16: 100 mg via INTRAVENOUS

## 2020-05-16 MED ORDER — ONDANSETRON HCL 4 MG/2ML IJ SOLN
INTRAMUSCULAR | Status: DC | PRN
  Administered 2020-05-16: 4 mg via INTRAVENOUS

## 2020-05-16 MED ORDER — DEXAMETHASONE SODIUM PHOSPHATE 10 MG/ML IJ SOLN
INTRAMUSCULAR | Status: AC
  Filled 2020-05-16: qty 1

## 2020-05-16 MED ORDER — MIDAZOLAM HCL 2 MG/2ML IJ SOLN
INTRAMUSCULAR | Status: AC
  Filled 2020-05-16: qty 2

## 2020-05-16 MED ORDER — FENTANYL CITRATE (PF) 100 MCG/2ML IJ SOLN
25.0000 ug | INTRAMUSCULAR | Status: AC | PRN
  Administered 2020-05-16 (×2): 25 ug via INTRAVENOUS
  Administered 2020-05-16: 50 ug via INTRAVENOUS
  Administered 2020-05-16: 25 ug via INTRAVENOUS
  Administered 2020-05-16: 50 ug via INTRAVENOUS
  Administered 2020-05-16: 25 ug via INTRAVENOUS

## 2020-05-16 MED ORDER — FENTANYL CITRATE (PF) 100 MCG/2ML IJ SOLN
INTRAMUSCULAR | Status: DC | PRN
  Administered 2020-05-16: 50 ug via INTRAVENOUS
  Administered 2020-05-16: 100 ug via INTRAVENOUS
  Administered 2020-05-16: 50 ug via INTRAVENOUS

## 2020-05-16 MED ORDER — ACETAMINOPHEN 10 MG/ML IV SOLN
INTRAVENOUS | Status: DC | PRN
  Administered 2020-05-16: 1000 mg via INTRAVENOUS

## 2020-05-16 MED ORDER — BUPIVACAINE-EPINEPHRINE (PF) 0.25% -1:200000 IJ SOLN
INTRAMUSCULAR | Status: AC
  Filled 2020-05-16: qty 30

## 2020-05-16 MED ORDER — LIDOCAINE HCL (PF) 2 % IJ SOLN
INTRAMUSCULAR | Status: AC
  Filled 2020-05-16: qty 5

## 2020-05-16 MED ORDER — MIDAZOLAM HCL 2 MG/2ML IJ SOLN
INTRAMUSCULAR | Status: DC | PRN
  Administered 2020-05-16: 2 mg via INTRAVENOUS

## 2020-05-16 MED ORDER — HYDROMORPHONE HCL 1 MG/ML IJ SOLN
0.5000 mg | INTRAMUSCULAR | Status: DC | PRN
Start: 2020-05-16 — End: 2020-05-16

## 2020-05-16 MED ORDER — PHENYLEPHRINE HCL (PRESSORS) 10 MG/ML IV SOLN
INTRAVENOUS | Status: AC
  Filled 2020-05-16: qty 1

## 2020-05-16 SURGICAL SUPPLY — 51 items
ADH SKN CLS APL DERMABOND .7 (GAUZE/BANDAGES/DRESSINGS) ×1
APL PRP STRL LF DISP 70% ISPRP (MISCELLANEOUS) ×1
BAG INFUSER PRESSURE 100CC (MISCELLANEOUS) IMPLANT
BLADE SURG SZ11 CARB STEEL (BLADE) ×2 IMPLANT
CANISTER SUCT 1200ML W/VALVE (MISCELLANEOUS) ×1 IMPLANT
CHLORAPREP W/TINT 26 (MISCELLANEOUS) ×2 IMPLANT
COVER TIP SHEARS 8 DVNC (MISCELLANEOUS) ×1 IMPLANT
COVER TIP SHEARS 8MM DA VINCI (MISCELLANEOUS) ×1
COVER WAND RF STERILE (DRAPES) ×2 IMPLANT
DEFOGGER SCOPE WARMER CLEARIFY (MISCELLANEOUS) ×2 IMPLANT
DERMABOND ADVANCED (GAUZE/BANDAGES/DRESSINGS) ×1
DERMABOND ADVANCED .7 DNX12 (GAUZE/BANDAGES/DRESSINGS) ×1 IMPLANT
DRAPE ARM DVNC X/XI (DISPOSABLE) ×3 IMPLANT
DRAPE COLUMN DVNC XI (DISPOSABLE) ×1 IMPLANT
DRAPE DA VINCI XI ARM (DISPOSABLE) ×3
DRAPE DA VINCI XI COLUMN (DISPOSABLE) ×1
ELECT REM PT RETURN 9FT ADLT (ELECTROSURGICAL) ×2
ELECTRODE REM PT RTRN 9FT ADLT (ELECTROSURGICAL) ×1 IMPLANT
GLOVE SURG ENC MOIS LTX SZ6.5 (GLOVE) ×4 IMPLANT
GLOVE SURG UNDER POLY LF SZ6.5 (GLOVE) ×4 IMPLANT
GOWN STRL REUS W/ TWL LRG LVL3 (GOWN DISPOSABLE) ×3 IMPLANT
GOWN STRL REUS W/TWL LRG LVL3 (GOWN DISPOSABLE) ×6
IRRIGATOR SUCT 8 DISP DVNC XI (IRRIGATION / IRRIGATOR) IMPLANT
IRRIGATOR SUCTION 8MM XI DISP (IRRIGATION / IRRIGATOR)
IV NS 1000ML (IV SOLUTION)
IV NS 1000ML BAXH (IV SOLUTION) IMPLANT
KIT PINK PAD W/HEAD ARE REST (MISCELLANEOUS) ×2
KIT PINK PAD W/HEAD ARM REST (MISCELLANEOUS) ×1 IMPLANT
LABEL OR SOLS (LABEL) ×1 IMPLANT
MANIFOLD NEPTUNE II (INSTRUMENTS) ×1 IMPLANT
MESH VENTRALIGHT ST 4.5IN (Mesh General) ×1 IMPLANT
NDL INSUFFLATION 14GA 120MM (NEEDLE) ×1 IMPLANT
NEEDLE HYPO 22GX1.5 SAFETY (NEEDLE) ×2 IMPLANT
NEEDLE INSUFFLATION 14GA 120MM (NEEDLE) ×2 IMPLANT
NS IRRIG 500ML POUR BTL (IV SOLUTION) ×2 IMPLANT
OBTURATOR OPTICAL STANDARD 8MM (TROCAR) ×1
OBTURATOR OPTICAL STND 8 DVNC (TROCAR) ×1
OBTURATOR OPTICALSTD 8 DVNC (TROCAR) ×1 IMPLANT
PACK LAP CHOLECYSTECTOMY (MISCELLANEOUS) ×2 IMPLANT
SEAL CANN UNIV 5-8 DVNC XI (MISCELLANEOUS) ×3 IMPLANT
SEAL XI 5MM-8MM UNIVERSAL (MISCELLANEOUS) ×3
SET TUBE SMOKE EVAC HIGH FLOW (TUBING) ×2 IMPLANT
SOLUTION ELECTROLUBE (MISCELLANEOUS) ×2 IMPLANT
SUT MNCRL 4-0 (SUTURE) ×2
SUT MNCRL 4-0 27XMFL (SUTURE) ×1
SUT STRATAFIX PDS 30 CT-1 (SUTURE) ×2 IMPLANT
SUT VICRYL 0 AB UR-6 (SUTURE) ×2 IMPLANT
SUT VLOC 90 S/L VL9 GS22 (SUTURE) ×3 IMPLANT
SUTURE MNCRL 4-0 27XMF (SUTURE) ×1 IMPLANT
TAPE TRANSPORE STRL 2 31045 (GAUZE/BANDAGES/DRESSINGS) ×1 IMPLANT
TROCAR XCEL NON-BLD 5MMX100MML (ENDOMECHANICALS) ×1 IMPLANT

## 2020-05-16 NOTE — Anesthesia Preprocedure Evaluation (Signed)
Anesthesia Evaluation  Patient identified by MRN, date of birth, ID band Patient awake    Reviewed: Allergy & Precautions, H&P , NPO status , Patient's Chart, lab work & pertinent test results  History of Anesthesia Complications Negative for: history of anesthetic complications  Airway Mallampati: III  TM Distance: >3 FB Neck ROM: full    Dental  (+) Chipped, Poor Dentition, Missing   Pulmonary neg shortness of breath, asthma , sleep apnea ,    Pulmonary exam normal        Cardiovascular Exercise Tolerance: Good hypertension, (-) angina(-) Past MI and (-) DOE Normal cardiovascular exam     Neuro/Psych negative neurological ROS  negative psych ROS   GI/Hepatic Neg liver ROS, GERD  Medicated and Controlled,  Endo/Other  diabetes, Type 2, Oral Hypoglycemic AgentsHypothyroidism   Renal/GU      Musculoskeletal  (+) Arthritis ,   Abdominal   Peds  Hematology negative hematology ROS (+)   Anesthesia Other Findings Past Medical History: No date: Asthma No date: Diabetes mellitus without complication (HCC) No date: GERD (gastroesophageal reflux disease) No date: Gout No date: Hypertension No date: Hypertriglyceridemia No date: Hypothyroidism No date: Sleep apnea  Past Surgical History: No date: SINUS EXPLORATION 8003: UMBILICAL HERNIA REPAIR     Comment:  Dr. Rochel Brome     Reproductive/Obstetrics negative OB ROS                             Anesthesia Physical Anesthesia Plan  ASA: III  Anesthesia Plan: General ETT   Post-op Pain Management:    Induction: Intravenous  PONV Risk Score and Plan: Ondansetron, Dexamethasone, Midazolam and Treatment may vary due to age or medical condition  Airway Management Planned: Oral ETT  Additional Equipment:   Intra-op Plan:   Post-operative Plan: Extubation in OR  Informed Consent: I have reviewed the patients History and  Physical, chart, labs and discussed the procedure including the risks, benefits and alternatives for the proposed anesthesia with the patient or authorized representative who has indicated his/her understanding and acceptance.     Dental Advisory Given  Plan Discussed with: Anesthesiologist, CRNA and Surgeon  Anesthesia Plan Comments: (Patient consented for risks of anesthesia including but not limited to:  - adverse reactions to medications - damage to eyes, teeth, lips or other oral mucosa - nerve damage due to positioning  - sore throat or hoarseness - Damage to heart, brain, nerves, lungs, other parts of body or loss of life  Patient voiced understanding.)        Anesthesia Quick Evaluation

## 2020-05-16 NOTE — Discharge Instructions (Signed)
  Diet: Resume home heart healthy regular diet.   Activity: No heavy lifting >20 pounds (children, pets, laundry, garbage) or strenuous activity until follow-up, but light activity and walking are encouraged. Do not drive or drink alcohol if taking narcotic pain medications.  Wound care: May shower with soapy water and pat dry (do not rub incisions), but no baths or submerging incision underwater until follow-up. (no swimming)   Medications: Resume all home medications. For mild to moderate pain: acetaminophen (Tylenol) or ibuprofen (if no kidney disease). Combining Tylenol with alcohol can substantially increase your risk of causing liver disease. Narcotic pain medications, if prescribed, can be used for severe pain, though may cause nausea, constipation, and drowsiness. Do not combine Tylenol and Norco within a 6 hour period as Norco contains Tylenol. If you do not need the narcotic pain medication, you do not need to fill the prescription.  Call office ([phone]) at any time if any questions, worsening pain, fevers/chills, bleeding, drainage from incision site, or other concerns.   AMBULATORY SURGERY  DISCHARGE INSTRUCTIONS   1) The drugs that you were given will stay in your system until tomorrow so for the next 24 hours you should not:  A) Drive an automobile B) Make any legal decisions C) Drink any alcoholic beverage   2) You may resume regular meals tomorrow.  Today it is better to start with liquids and gradually work up to solid foods.  You may eat anything you prefer, but it is better to start with liquids, then soup and crackers, and gradually work up to solid foods.   3) Please notify your doctor immediately if you have any unusual bleeding, trouble breathing, redness and pain at the surgery site, drainage, fever, or pain not relieved by medication.    4) Additional Instructions:        Please contact your physician with any problems or Same Day Surgery at  [phone], Monday through Friday 6 am to 4 pm, or [place] at [facility] Main number at [phone]. 

## 2020-05-16 NOTE — Anesthesia Procedure Notes (Signed)
Procedure Name: Intubation Date/Time: 05/16/2020 8:24 AM Performed by: Lerry Liner, CRNA Pre-anesthesia Checklist: Patient identified, Emergency Drugs available, Suction available and Patient being monitored Patient Re-evaluated:Patient Re-evaluated prior to induction Oxygen Delivery Method: Circle system utilized Preoxygenation: Pre-oxygenation with 100% oxygen Induction Type: IV induction Ventilation: Mask ventilation without difficulty Laryngoscope Size: McGraph and 4 Grade View: Grade II Tube type: Oral Tube size: 7.0 mm Number of attempts: 1 Airway Equipment and Method: Stylet and Oral airway Placement Confirmation: ETT inserted through vocal cords under direct vision,  positive ETCO2 and breath sounds checked- equal and bilateral Secured at: 21 cm Tube secured with: Tape Dental Injury: Teeth and Oropharynx as per pre-operative assessment

## 2020-05-16 NOTE — Interval H&P Note (Signed)
History and Physical Interval Note:  05/16/2020 7:25 AM  Robert Skinner  has presented today for surgery, with the diagnosis of P39.6 Recurrent umbilical hernia w/ incaceration.  The various methods of treatment have been discussed with the patient and family. After consideration of risks, benefits and other options for treatment, the patient has consented to  Procedure(s): XI [place] (N/A) as a surgical intervention.  The patient's history has been reviewed, patient examined, no change in status, stable for surgery.  I have reviewed the patient's chart and labs.  Questions were answered to the patient's satisfaction.     Herbert Pun

## 2020-05-16 NOTE — Op Note (Signed)
Preoperative diagnosis: Umbilical hernia (recurrent)  Postoperative diagnosis: Umbilical Hernia (recurrent)  Procedure: Robotic assisted laparoscopic umbilical hernia repair with mesh  Anesthesia: general  Surgeon: Herbert Pun, MD, FACS  Wound Classification: Clean  Specimen: none  Complications: None  Estimated Blood Loss: 5 mL  Indications: A 50 year old male with symptomatic recurrent incarcerated umbilical hernia. Repair indicated to improve pain and avoid complications such as strangulation.   Findings: 1. 1.5 cm recurrent incarcerated umbilical hernia   2. Tension free repair achieved with 1.4 round mesh and suture   3. Adequate hemostasis  Description of procedure: The patient was brought to the operating room and general anesthesia was induced. A time-out was completed verifying correct patient, procedure, site, positioning, and implant(s) and/or special equipment prior to beginning this procedure. Antibiotics were administered prior to making the incision. SCDs placed. The anterior abdominal wall was prepped and draped in the standard sterile fashion.   Palmer's point chosen for entry.  Veress needle placed and abdomen insufflated to 15cm without any dramatic increase in pressure.  Needle removed and optiview technique used to place 8 mm port at same point.  No injury noted during placement. 2 additional ports were placed along left lateral aspect.  Xi robot then docked into place.  Hernia contents noted and reduced with combination of blunt, sharp dissection with scissors and fenestrated forceps.  Hemostasis achieved throughout this portion.  Once all hernia contents reduced, there was noted to be a 1.5 cm hernia defect.    Insufflation dropped to 10mm and transfacial suture with 0 stratafix used to primarily close defect under minimal tension. Bard protected 11.4 cm round mesh was placed within the abdominal cavity through the port and secured to the abdominal  wall centered over the defect using the 0 stratafix previously used to primarily close defect.  The mesh was then circumferentially sutured into the anterior abdominal wall using 2-0 VLock x2.  Any bleeding noted during this portion was no longer actively bleeding by end of securing mesh and tightening the suture.   Robot was undocked.  Abdomen then desufflated while camera within abdomen to ensure no signs of new bleed prior to removing camera and rest of ports completely.  All skin incisions closed with runninrg 4-0 Monocryl in a subcuticular fashion.  All wounds then dressed with Dermabond.  Patient was then successfully awakened and transferred to PACU in stable condition.  At the end of the procedure sponge and instrument counts were correct.

## 2020-05-16 NOTE — Transfer of Care (Signed)
Immediate Anesthesia Transfer of Care Note  Patient: Robert Skinner  Procedure(s) Performed: XI ROBOT ASSISTED UMBILICAL HERNIA REPAIR (N/A Abdomen)  Patient Location: PACU  Anesthesia Type:General  Level of Consciousness: drowsy  Airway & Oxygen Therapy: Patient Spontanous Breathing and Patient connected to face mask oxygen  Post-op Assessment: Report given to RN  Post vital signs: stable  Last Vitals:  Vitals Value Taken Time  BP 150/85 05/16/20 1004  Temp    Pulse 76 05/16/20 1005  Resp 20 05/16/20 1005  SpO2 100 % 05/16/20 1005  Vitals shown include unvalidated device data.  Last Pain:  Vitals:   05/16/20 0758  TempSrc: Oral  PainSc: 0-No pain         Complications: No complications documented.

## 2020-05-17 NOTE — Anesthesia Postprocedure Evaluation (Signed)
Anesthesia Post Note  Patient: Robert Skinner  Procedure(s) Performed: XI ROBOT ASSISTED UMBILICAL HERNIA REPAIR (N/A Abdomen)  Patient location during evaluation: PACU Anesthesia Type: General Level of consciousness: awake and alert Pain management: pain level controlled Vital Signs Assessment: post-procedure vital signs reviewed and stable Respiratory status: spontaneous breathing, nonlabored ventilation, respiratory function stable and patient connected to nasal cannula oxygen Cardiovascular status: blood pressure returned to baseline and stable Postop Assessment: no apparent nausea or vomiting Anesthetic complications: no   No complications documented.   Last Vitals:  Vitals:   05/16/20 1147 05/16/20 1221  BP: (!) 153/94 (!) 153/94  Pulse: 74 73  Resp: 18 20  Temp: (!) 35.8 C   SpO2: 97% 95%    Last Pain:  Vitals:   05/17/20 0831  TempSrc:   PainSc: 6                  Precious Haws Piscitello

## 2020-06-15 ENCOUNTER — Other Ambulatory Visit: Payer: Self-pay

## 2020-06-15 ENCOUNTER — Encounter: Payer: Self-pay | Admitting: *Deleted

## 2020-06-15 DIAGNOSIS — E039 Hypothyroidism, unspecified: Secondary | ICD-10-CM | POA: Insufficient documentation

## 2020-06-15 DIAGNOSIS — Z7984 Long term (current) use of oral hypoglycemic drugs: Secondary | ICD-10-CM | POA: Diagnosis not present

## 2020-06-15 DIAGNOSIS — I10 Essential (primary) hypertension: Secondary | ICD-10-CM | POA: Diagnosis not present

## 2020-06-15 DIAGNOSIS — H538 Other visual disturbances: Secondary | ICD-10-CM | POA: Insufficient documentation

## 2020-06-15 DIAGNOSIS — J45909 Unspecified asthma, uncomplicated: Secondary | ICD-10-CM | POA: Diagnosis not present

## 2020-06-15 DIAGNOSIS — R739 Hyperglycemia, unspecified: Secondary | ICD-10-CM | POA: Diagnosis present

## 2020-06-15 DIAGNOSIS — E1165 Type 2 diabetes mellitus with hyperglycemia: Secondary | ICD-10-CM | POA: Insufficient documentation

## 2020-06-15 DIAGNOSIS — R3589 Other polyuria: Secondary | ICD-10-CM | POA: Insufficient documentation

## 2020-06-15 DIAGNOSIS — Z79899 Other long term (current) drug therapy: Secondary | ICD-10-CM | POA: Diagnosis not present

## 2020-06-15 LAB — CBC
HCT: 42.8 % (ref 39.0–52.0)
Hemoglobin: 14.6 g/dL (ref 13.0–17.0)
MCH: 28.1 pg (ref 26.0–34.0)
MCHC: 34.1 g/dL (ref 30.0–36.0)
MCV: 82.3 fL (ref 80.0–100.0)
Platelets: 386 10*3/uL (ref 150–400)
RBC: 5.2 MIL/uL (ref 4.22–5.81)
RDW: 14.1 % (ref 11.5–15.5)
WBC: 20.3 10*3/uL — ABNORMAL HIGH (ref 4.0–10.5)
nRBC: 0 % (ref 0.0–0.2)

## 2020-06-15 LAB — CBG MONITORING, ED
Glucose-Capillary: >600 mg/dL (ref 70–99)
Glucose-Capillary: 503 mg/dL (ref 70–99)

## 2020-06-15 LAB — URINALYSIS, COMPLETE (UACMP) WITH MICROSCOPIC
Bacteria, UA: NONE SEEN
Bilirubin Urine: NEGATIVE
Glucose, UA: >=500 mg/dL — AB
Hgb urine dipstick: NEGATIVE
Ketones, ur: NEGATIVE mg/dL
Leukocytes,Ua: NEGATIVE
Nitrite: NEGATIVE
Protein, ur: NEGATIVE mg/dL
Specific Gravity, Urine: 1.028 (ref 1.005–1.030)
Squamous Epithelial / HPF: NONE SEEN (ref 0–5)
pH: 5 (ref 5.0–8.0)

## 2020-06-15 LAB — BASIC METABOLIC PANEL
Anion gap: 11 (ref 5–15)
BUN: 21 mg/dL — ABNORMAL HIGH (ref 6–20)
CO2: 23 mmol/L (ref 22–32)
Calcium: 8.9 mg/dL (ref 8.9–10.3)
Chloride: 95 mmol/L — ABNORMAL LOW (ref 98–111)
Creatinine, Ser: 1.19 mg/dL (ref 0.61–1.24)
GFR, Estimated: >60 mL/min (ref >60)
Glucose, Bld: 602 mg/dL (ref 70–99)
Potassium: 4.7 mmol/L (ref 3.5–5.1)
Sodium: 129 mmol/L — ABNORMAL LOW (ref 135–145)

## 2020-06-15 MED ORDER — SODIUM CHLORIDE 0.9 % IV BOLUS
1000.0000 mL | Freq: Once | INTRAVENOUS | Status: AC
  Administered 2020-06-15: 1000 mL via INTRAVENOUS

## 2020-06-15 NOTE — ED Notes (Signed)
Patient is resting on the recliner. Patient has TV remote and his cell phone. Patient has call bell within reach. NS is about 1/2 done.

## 2020-06-15 NOTE — ED Notes (Signed)
Critical CBG: 602, MD aware fluids being administered

## 2020-06-15 NOTE — ED Notes (Signed)
Patient is on his 9th day of Clindamycin, Cipro and Prednisone s/p hernia surgery on 3/2 and subsequent staph infection.

## 2020-06-15 NOTE — ED Notes (Signed)
Patient was given ice chips per request.

## 2020-06-15 NOTE — ED Triage Notes (Signed)
Patient states he took his blood sugar and it was 541. Patient is on steroids at this time for a staph infection that was diagnosed 10 days ago.. Patient states he takes Metformin 500mg  (2 tablets) once a day and just took them prior to arrival. Patient c/o blurry vision.

## 2020-06-16 ENCOUNTER — Emergency Department
Admission: EM | Admit: 2020-06-16 | Discharge: 2020-06-16 | Disposition: A | Discharge status: Home / Self Care | Payer: Managed Care, Other (non HMO) | Attending: Emergency Medicine | Admitting: Emergency Medicine

## 2020-06-16 DIAGNOSIS — R739 Hyperglycemia, unspecified: Secondary | ICD-10-CM

## 2020-06-16 LAB — CBG MONITORING, ED
Glucose-Capillary: 300 mg/dL — ABNORMAL HIGH (ref 70–99)
Glucose-Capillary: 386 mg/dL — ABNORMAL HIGH (ref 70–99)

## 2020-06-16 MED ORDER — INSULIN ASPART 100 UNIT/ML ~~LOC~~ SOLN
10.0000 [IU] | Freq: Once | SUBCUTANEOUS | Status: AC
  Administered 2020-06-16: 10 [IU] via SUBCUTANEOUS
  Filled 2020-06-16: qty 1

## 2020-06-16 MED ORDER — SODIUM CHLORIDE 0.9 % IV BOLUS
1000.0000 mL | Freq: Once | INTRAVENOUS | Status: AC
  Administered 2020-06-16: 1000 mL via INTRAVENOUS

## 2020-06-16 NOTE — ED Notes (Signed)
MD notified of CBG recheck of 300.

## 2020-06-16 NOTE — ED Provider Notes (Signed)
[facility] Emergency Department Provider Note ____________________________________________   Event Date/Time   First MD Initiated Contact with Patient 06/16/20 0025     (approximate)  I have reviewed the triage vital signs and the nursing notes.   HISTORY  Chief Complaint Hyperglycemia    HPI Robert Skinner is a 50 y.o. male with PMH as noted below who presents with hyperglycemia over the last several days, measured to as high as the 500s or HI on his glucometer.  The patient reports associated fatigue, thirst, polyuria, and blurred vision.  He states that he is currently on a course of antibiotics and steroids for sinus infection.  It is a 10-day course of steroids and he is on day 7.  He reports that the sinusitis symptoms have improved.  Past Medical History:  Diagnosis Date  . Asthma   . Diabetes mellitus without complication ([place])   . GERD (gastroesophageal reflux disease)   . Gout   . Hypertension   . Hypertriglyceridemia   . Hypothyroidism   . Sleep apnea     Patient Active Problem List   Diagnosis Date Noted  . Hyperglycemia 07/10/2016  . Sleep apnea, obstructive 06/23/2016  . 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 Acquired hypothyroidism 11/18/2013  . Hypertriglyceridemia 11/18/2013  . LFT elevation 11/18/2013  . Low serum testosterone level 11/18/2013    Past Surgical History:  Procedure Laterality Date  . SINUS EXPLORATION    . UMBILICAL HERNIA REPAIR  2012   Dr. Rochel Brome    Prior to Admission medications   Medication Sig Start Date End Date Taking? Authorizing Provider  acetaminophen (TYLENOL) 500 MG tablet Take 500-1,000 mg by mouth every 6 (six) hours as needed (pain).    [provider]  albuterol (VENTOLIN HFA) 108 (90 Base) MCG/ACT inhaler Inhale 1-2 puffs into the lungs every 4 (four) hours as needed for wheezing or shortness of breath. 04/10/20   [provider]  allopurinol (ZYLOPRIM) 100 MG tablet  Take 100 mg by mouth daily.    [provider]  allopurinol (ZYLOPRIM) 300 MG tablet Take 300 mg by mouth daily. 05/02/20   [provider]  colchicine 0.6 MG tablet Take 0.6 mg by mouth daily as needed (gout).    [provider]  cyclobenzaprine (FLEXERIL) 10 MG tablet Take 10 mg by mouth daily. 04/26/20   [provider]  fexofenadine (ALLEGRA) 180 MG tablet Take 180 mg by mouth daily.    [provider]  fluticasone (FLONASE) 50 MCG/ACT nasal spray Place 2 sprays into both nostrils daily. Patient taking differently: Place 2 sprays into both nostrils daily as needed for allergies. 06/06/16   Fisher, Linden Dolin, PA-C  indomethacin (INDOCIN) 50 MG capsule Take 50 mg by mouth daily as needed (gout).    [provider]  levothyroxine (SYNTHROID) 150 MCG tablet Take 150 mcg by mouth daily before breakfast. 05/02/20   [provider]  losartan (COZAAR) 50 MG tablet Take 50 mg by mouth daily. 02/24/20   [provider]  metFORMIN (GLUCOPHAGE-XR) 500 MG 24 hr tablet Take 1,000 mg by mouth at bedtime. 05/12/19 05/11/20  [provider]  montelukast (SINGULAIR) 10 MG tablet Take 10 mg by mouth at bedtime. 04/03/20   [provider]  pantoprazole (PROTONIX) 40 MG tablet Take 40 mg by mouth daily. 05/01/20   [provider]  rosuvastatin (CRESTOR) 20 MG tablet Take 20 mg by mouth daily. 05/01/20   [provider]  traZODone (DESYREL) 50 MG tablet Take 50 mg by mouth at bedtime as needed for sleep. 04/02/20   [provider]    Allergies Probenecid, Moxifloxacin, and Penicillins  No family history on file.  Social History Social History   Tobacco Use  . Smoking status: Never Smoker  . Smokeless tobacco: Never Used  Vaping Use  . Vaping Use: Never used  Substance Use Topics  . Alcohol use: No  . Drug use: Never    Review of Systems  Constitutional: No fever. Eyes: No redness.  Positive  for bilateral blurred vision. ENT: No sore throat. Cardiovascular: Denies chest pain. Respiratory: Denies shortness of breath. Gastrointestinal: No vomiting or diarrhea.  Genitourinary: Negative for dysuria.  Positive for polyuria. Musculoskeletal: Negative for back pain. Skin: Negative for rash. Neurological: Negative for headache.   ____________________________________________   PHYSICAL EXAM:  VITAL SIGNS: ED Triage Vitals  Enc Vitals Group     BP 06/15/20 2144 (!) 159/94     Pulse Rate 06/15/20 2144 64     Resp 06/15/20 2144 18     Temp 06/15/20 2144 97.8 F (36.6 C)     Temp Source 06/15/20 2144 Oral     SpO2 06/15/20 2144 97 %     Weight 06/15/20 2146 295 lb (133.8 kg)     Height 06/15/20 2146 6\' 1"  (1.854 m)     Head Circumference --      Peak Flow --      Pain Score 06/15/20 2146 0     Pain Loc --      Pain Edu? --      Excl. in [place]? --     Constitutional: Alert and oriented. Well appearing and in no acute distress. Eyes: Conjunctivae are normal.  Head: Atraumatic. Nose: No congestion/rhinnorhea. Mouth/Throat: Mucous membranes are dry. Neck: Normal range of motion.  Cardiovascular: Normal rate, regular rhythm.   Good peripheral circulation. Respiratory: Normal respiratory effort.  No retractions.  Gastrointestinal: No distention.  Musculoskeletal: No lower extremity edema.  Extremities warm and well perfused.  Neurologic:  Normal speech and language. No gross focal neurologic deficits are appreciated.  Skin:  Skin is warm and dry. No rash noted. Psychiatric: Mood and affect are normal. Speech and behavior are normal.  ____________________________________________   LABS (all labs ordered are listed, but only abnormal results are displayed)  Labs Reviewed  BASIC METABOLIC PANEL - Abnormal; Notable for the following components:      Result Value   Sodium 129 (*)    Chloride 95 (*)    Glucose, Bld 602 (*)    BUN 21 (*)    All other components within  normal limits  CBC - Abnormal; Notable for the following components:   WBC 20.3 (*)    All other components within normal limits  URINALYSIS, COMPLETE (UACMP) WITH MICROSCOPIC - Abnormal; Notable for the following components:   Color, Urine STRAW (*)    APPearance CLEAR (*)    Glucose, UA >=500 (*)    All other components within normal limits  CBG MONITORING, ED - Abnormal; Notable for the following components:   Glucose-Capillary >600 (*)    All other components within normal limits  CBG MONITORING, ED - Abnormal; Notable for the following components:   Glucose-Capillary 503 (*)    All other components within normal limits  CBG MONITORING, ED - Abnormal; Notable for the following components:   Glucose-Capillary 386 (*)    All other components within normal limits  CBG  MONITORING, ED - Abnormal; Notable for the following components:   Glucose-Capillary 300 (*)    All other components within normal limits   ____________________________________________  EKG   ____________________________________________  RADIOLOGY    ____________________________________________   PROCEDURES  Procedure(s) performed: No  Procedures  Critical Care performed: No ____________________________________________   INITIAL IMPRESSION / ASSESSMENT AND PLAN / ED COURSE  Pertinent labs & imaging results that were available during my care of the patient were reviewed by me and considered in my medical decision making (see chart for details).  50 year old male with PMH as noted above including diabetes on metformin presents with hyperglycemia over the last several days.  He has associated polydipsia, polyuria, fatigue, and blurred vision.  He is currently being treated for sinus infection with antibiotics and steroids and is close to the end of a 10-day course.  I reviewed the past medical records in [place], however I do not have access to the ENT clinic notes.  The patient also  recently had hernia surgery on 3/2.  On exam, the patient is overall well-appearing.  His vital signs are normal.  The physical exam is remarkable only for slightly dry mucous membranes.  Initial lab work-up reveals a glucose of 600 with a normal anion gap.  He has leukocytosis but no other concerning lab findings.  Overall presentation is consistent with hyperglycemia without DKA or other acute complication.  I suspect that it is related to the steroids.  The leukocytosis also goes along with this.  The blurred vision is bilateral and consistent with hyperglycemia.  We will give fluids and insulin here, and I will recommend that he discontinue the steroids early.  ----------------------------------------- 3:05 AM on 06/16/2020 -----------------------------------------  The glucose is now 300.  On reassessment, the patient appears comfortable.  He is stable for discharge at this time.  I counseled him on the results of the work-up and the plan of care.  I advised that he finish the antibiotics but discontinue the steroid.  He does not need to taper since he has only been on it for a week.  I instructed him to follow-up with his PMD and his ENT.  Return precautions provided, and he expressed understanding. ____________________________________________   FINAL CLINICAL IMPRESSION(S) / ED DIAGNOSES  Final diagnoses:  Hyperglycemia      NEW MEDICATIONS STARTED DURING THIS VISIT:  New Prescriptions   No medications on file     Note:  This document was prepared using Dragon voice recognition software and may include unintentional dictation errors.    Arta Silence, MD 06/16/20 [phone]

## 2020-06-16 NOTE — ED Notes (Signed)
CBG rechecked at 0045: 386.

## 2020-06-16 NOTE — Discharge Instructions (Addendum)
Continue taking your Metformin as prescribed.  You should continue the antibiotics that were prescribed you and finish the full course, but do not continue taking the steroid.  Call your ENT on Monday for further instructions.  Return to the ER for new, worsening, or persistent high blood sugars, worsening blurred vision, vomiting, weakness, or any other new or worsening symptoms that concern you.

## 2020-06-18 ENCOUNTER — Other Ambulatory Visit: Payer: Self-pay

## 2020-06-18 ENCOUNTER — Emergency Department
Admission: EM | Admit: 2020-06-18 | Discharge: 2020-06-18 | Disposition: A | Discharge status: Home / Self Care | Payer: Managed Care, Other (non HMO) | Attending: Emergency Medicine | Admitting: Emergency Medicine

## 2020-06-18 DIAGNOSIS — I10 Essential (primary) hypertension: Secondary | ICD-10-CM | POA: Diagnosis not present

## 2020-06-18 DIAGNOSIS — R739 Hyperglycemia, unspecified: Secondary | ICD-10-CM

## 2020-06-18 DIAGNOSIS — H538 Other visual disturbances: Secondary | ICD-10-CM | POA: Insufficient documentation

## 2020-06-18 DIAGNOSIS — Z79899 Other long term (current) drug therapy: Secondary | ICD-10-CM | POA: Insufficient documentation

## 2020-06-18 DIAGNOSIS — E039 Hypothyroidism, unspecified: Secondary | ICD-10-CM | POA: Insufficient documentation

## 2020-06-18 DIAGNOSIS — E1165 Type 2 diabetes mellitus with hyperglycemia: Secondary | ICD-10-CM | POA: Diagnosis not present

## 2020-06-18 DIAGNOSIS — Z7984 Long term (current) use of oral hypoglycemic drugs: Secondary | ICD-10-CM | POA: Diagnosis not present

## 2020-06-18 LAB — URINALYSIS, COMPLETE (UACMP) WITH MICROSCOPIC
Bacteria, UA: NONE SEEN
Bilirubin Urine: NEGATIVE
Glucose, UA: >=500 mg/dL — AB
Hgb urine dipstick: NEGATIVE
Ketones, ur: NEGATIVE mg/dL
Leukocytes,Ua: NEGATIVE
Nitrite: NEGATIVE
Protein, ur: NEGATIVE mg/dL
Specific Gravity, Urine: 1.021 (ref 1.005–1.030)
pH: 5 (ref 5.0–8.0)

## 2020-06-18 LAB — HEMOGLOBIN A1C
Hgb A1c MFr Bld: 11 % — ABNORMAL HIGH (ref 4.8–5.6)
Mean Plasma Glucose: 269 mg/dL

## 2020-06-18 LAB — CBC
HCT: 45.1 % (ref 39.0–52.0)
Hemoglobin: 15.6 g/dL (ref 13.0–17.0)
MCH: 28 pg (ref 26.0–34.0)
MCHC: 34.6 g/dL (ref 30.0–36.0)
MCV: 81 fL (ref 80.0–100.0)
Platelets: 338 10*3/uL (ref 150–400)
RBC: 5.57 MIL/uL (ref 4.22–5.81)
RDW: 14.2 % (ref 11.5–15.5)
WBC: 14.9 10*3/uL — ABNORMAL HIGH (ref 4.0–10.5)
nRBC: 0 % (ref 0.0–0.2)

## 2020-06-18 LAB — BASIC METABOLIC PANEL
Anion gap: 8 (ref 5–15)
BUN: 14 mg/dL (ref 6–20)
CO2: 21 mmol/L — ABNORMAL LOW (ref 22–32)
Calcium: 9 mg/dL (ref 8.9–10.3)
Chloride: 102 mmol/L (ref 98–111)
Creatinine, Ser: 0.88 mg/dL (ref 0.61–1.24)
GFR, Estimated: >60 mL/min (ref >60)
Glucose, Bld: 351 mg/dL — ABNORMAL HIGH (ref 70–99)
Potassium: 3.9 mmol/L (ref 3.5–5.1)
Sodium: 131 mmol/L — ABNORMAL LOW (ref 135–145)

## 2020-06-18 LAB — CBG MONITORING, ED
Glucose-Capillary: 326 mg/dL — ABNORMAL HIGH (ref 70–99)
Glucose-Capillary: 331 mg/dL — ABNORMAL HIGH (ref 70–99)
Glucose-Capillary: 253 mg/dL — ABNORMAL HIGH (ref 70–99)

## 2020-06-18 MED ORDER — LACTATED RINGERS IV BOLUS
1000.0000 mL | Freq: Once | INTRAVENOUS | Status: AC
  Administered 2020-06-18: 1000 mL via INTRAVENOUS

## 2020-06-18 MED ORDER — INSULIN ASPART 100 UNIT/ML ~~LOC~~ SOLN
0.0000 [IU] | SUBCUTANEOUS | Status: DC
  Administered 2020-06-18: 11 [IU] via SUBCUTANEOUS
  Filled 2020-06-18: qty 1

## 2020-06-18 MED ORDER — TETRACAINE HCL 0.5 % OP SOLN
2.0000 [drp] | Freq: Once | OPHTHALMIC | Status: AC
  Administered 2020-06-18: 2 [drp] via OPHTHALMIC
  Filled 2020-06-18: qty 4

## 2020-06-18 MED ORDER — FLUORESCEIN SODIUM 1 MG OP STRP
1.0000 | ORAL_STRIP | Freq: Once | OPHTHALMIC | Status: AC
  Administered 2020-06-18: 1 via OPHTHALMIC
  Filled 2020-06-18: qty 1

## 2020-06-18 NOTE — ED Provider Notes (Signed)
[facility] Emergency Department Provider Note  ____________________________________________   Event Date/Time   First MD Initiated Contact with Patient 06/18/20 1448     (approximate)  I have reviewed the triage vital signs and the nursing notes.   HISTORY  Chief Complaint Hyperglycemia and Blurred Vision   HPI Robert Skinner is a 50 y.o. male with a past medical history of asthma, non-insulin-dependent DM, GERD, gout, HTN, HDL, hypothyroidism and OSA as well as recent laparoscopic repair of umbilical hernia 3/2 and recent diagnosis of sinus infection on Cipro and Clinda as well as recent steroid course discontinued on 1 secondary to blood sugars in the 600s who presents for assessment of some persistent bilateral blurry vision he has noticed since then.  He states he does not have any double vision or pain in either eye.  He states he feels little weak overall but no focal weakness.  Denies any headache, earache, vertigo, chest pain, cough, shortness of breath, abdominal pain, vomiting, diarrhea, dysuria, rash or recent injuries or falls.  States he was told by his PCP is blurry vision is likely related to his elevated blood sugars and that this was likely caused by recent steroids and sinus infection.  No other acute concerns at this time.  He has never worn contacts or glasses.         Past Medical History:  Diagnosis Date  . Asthma   . Diabetes mellitus without complication ([place])   . GERD (gastroesophageal reflux disease)   . Gout   . Hypertension   . Hypertriglyceridemia   . Hypothyroidism   . Sleep apnea     Patient Active Problem List   Diagnosis Date Noted  . Hyperglycemia 07/10/2016  . Sleep apnea, obstructive 06/23/2016  . 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 Acquired hypothyroidism 11/18/2013  . Hypertriglyceridemia 11/18/2013  . LFT elevation 11/18/2013  . Low serum testosterone level 11/18/2013    Past Surgical History:  Procedure  Laterality Date  . SINUS EXPLORATION    . UMBILICAL HERNIA REPAIR  2012   Dr. Rochel Brome    Prior to Admission medications   Medication Sig Start Date End Date Taking? Authorizing Provider  acetaminophen (TYLENOL) 500 MG tablet Take 500-1,000 mg by mouth every 6 (six) hours as needed (pain).    [provider]  albuterol (VENTOLIN HFA) 108 (90 Base) MCG/ACT inhaler Inhale 1-2 puffs into the lungs every 4 (four) hours as needed for wheezing or shortness of breath. 04/10/20   [provider]  allopurinol (ZYLOPRIM) 100 MG tablet Take 100 mg by mouth daily.    [provider]  allopurinol (ZYLOPRIM) 300 MG tablet Take 300 mg by mouth daily. 05/02/20   [provider]  colchicine 0.6 MG tablet Take 0.6 mg by mouth daily as needed (gout).    [provider]  cyclobenzaprine (FLEXERIL) 10 MG tablet Take 10 mg by mouth daily. 04/26/20   [provider]  fexofenadine (ALLEGRA) 180 MG tablet Take 180 mg by mouth daily.    [provider]  fluticasone (FLONASE) 50 MCG/ACT nasal spray Place 2 sprays into both nostrils daily. Patient taking differently: Place 2 sprays into both nostrils daily as needed for allergies. 06/06/16   Fisher, Linden Dolin, PA-C  indomethacin (INDOCIN) 50 MG capsule Take 50 mg by mouth daily as needed (gout).    [provider]  levothyroxine (SYNTHROID) 150 MCG tablet Take 150 mcg by mouth daily before breakfast. 05/02/20  [provider]  losartan (COZAAR) 50 MG tablet Take 50 mg by mouth daily. 02/24/20   [provider]  metFORMIN (GLUCOPHAGE-XR) 500 MG 24 hr tablet Take 1,000 mg by mouth at bedtime. 05/12/19 05/11/20  [provider]  montelukast (SINGULAIR) 10 MG tablet Take 10 mg by mouth at bedtime. 04/03/20   [provider]  pantoprazole (PROTONIX) 40 MG tablet Take 40 mg by mouth daily. 05/01/20   [provider]  rosuvastatin (CRESTOR) 20 MG tablet Take 20 mg by  mouth daily. 05/01/20   [provider]  traZODone (DESYREL) 50 MG tablet Take 50 mg by mouth at bedtime as needed for sleep. 04/02/20   [provider]    Allergies Probenecid, Moxifloxacin, and Penicillins  No family history on file.  Social History Social History   Tobacco Use  . Smoking status: Never Smoker  . Smokeless tobacco: Never Used  Vaping Use  . Vaping Use: Never used  Substance Use Topics  . Alcohol use: No  . Drug use: Never    Review of Systems  ROS    ____________________________________________   PHYSICAL EXAM:  VITAL SIGNS: ED Triage Vitals  Enc Vitals Group     BP 06/18/20 1406 (!) 169/94     Pulse Rate 06/18/20 1406 99     Resp 06/18/20 1406 18     Temp 06/18/20 1406 98.3 F (36.8 C)     Temp Source 06/18/20 1406 Oral     SpO2 06/18/20 1406 98 %     Weight --      Height --      Head Circumference --      Peak Flow --      Pain Score --      Pain Loc --      Pain Edu? --      Excl. in [place]? --    Vitals:   06/18/20 1406 06/18/20 1406  BP: (!) 169/94   Pulse: 99   Resp:  18  Temp: 98.3 F (36.8 C)   SpO2: 98%    Physical Exam Vitals and nursing note reviewed.  Constitutional:      Appearance: He is well-developed.  HENT:     Head: Normocephalic and atraumatic.     Right Ear: External ear normal.     Left Ear: External ear normal.     Nose: Nose normal.     Mouth/Throat:     Mouth: Mucous membranes are dry.  Eyes:     Conjunctiva/sclera: Conjunctivae normal.  Cardiovascular:     Rate and Rhythm: Normal rate and regular rhythm.     Heart sounds: No murmur heard.   Pulmonary:     Effort: Pulmonary effort is normal. No respiratory distress.     Breath sounds: Normal breath sounds.  Abdominal:     Palpations: Abdomen is soft.     Tenderness: There is no abdominal tenderness.  Musculoskeletal:     Cervical back: Neck supple.  Skin:    General: Skin is warm and dry.     Capillary Refill: Capillary  refill takes less than 2 seconds.  Neurological:     Mental Status: He is alert and oriented to person, place, and time.  Psychiatric:        Mood and Affect: Mood normal.     Cranial nerves II through XII grossly intact.  Patient has full and symmetric strength in all extremities.  No pronator drift.  No finger dysmetria.  Acuity is  20/40 bilaterally.  Pressures are on average on 3 readings at 27 in the right eye and 30 in the left eye. ____________________________________________   LABS (all labs ordered are listed, but only abnormal results are displayed)  Labs Reviewed  BASIC METABOLIC PANEL - Abnormal; Notable for the following components:      Result Value   Sodium 131 (*)    CO2 21 (*)    Glucose, Bld 351 (*)    All other components within normal limits  CBC - Abnormal; Notable for the following components:   WBC 14.9 (*)    All other components within normal limits  URINALYSIS, COMPLETE (UACMP) WITH MICROSCOPIC - Abnormal; Notable for the following components:   Color, Urine STRAW (*)    APPearance CLEAR (*)    Glucose, UA >=500 (*)    All other components within normal limits  CBG MONITORING, ED - Abnormal; Notable for the following components:   Glucose-Capillary 326 (*)    All other components within normal limits  CBG MONITORING, ED - Abnormal; Notable for the following components:   Glucose-Capillary 331 (*)    All other components within normal limits  HEMOGLOBIN A1C   ____________________________________________  EKG  ____________________________________________  RADIOLOGY  ED MD interpretation:    Official radiology report(s): No results found.  ____________________________________________   PROCEDURES  Procedure(s) performed (including Critical Care):  .1-3 Lead EKG Interpretation Performed by: Lucrezia Starch, MD Authorized by: Lucrezia Starch, MD     Interpretation: normal     ECG rate assessment: normal     Rhythm: sinus rhythm      Ectopy: none     Conduction: normal       ____________________________________________   INITIAL IMPRESSION / ASSESSMENT AND PLAN / ED COURSE      Patient presents with above to history exam for assessment of some blurry vision over the last couple of days and weakness.  This is in the setting of recently being on a couple days of prednisone and currently undergoing treatment for a sinus infection.  On arrival he is hypertensive with BP of 164 with otherwise stable vital signs on room air.  On exam he does have symmetric but decreased acuity in his both eyes but otherwise unremarkable ocular exam.  Suspect this is related to patient's elevated sugars as they were recently as high as 600 last week.  No history of recent trauma.  History and exam is not consistent with acute angle-closure glaucoma or acute infectious process.  No findings on exam to suggest CVA.  BMP today shows a glucose of 351 down from 602 3 days ago without any other significant electrolyte or metabolic derangements.  This is also likely why patient has been feeling this fatigue lately.  UA has some sugar but otherwise unremarkable.  Patient was given some fluids and a small dose of insulin and on recheck of her sugar was noted to be 331.   Given downtrending sugar with no evidence of DKA very low suspicion for other immediate life-threatening process including new acute infectious process, other significant metabolic derangement, trauma, or other immediate life-threatening pathology I believe patient safe for discharge with plan for close outpatient PCP follow-up and ophthalmology follow-up tomorrow.  Advised patient that he may very well need additional or titration of his diabetes medications while he has an infection and that these can be done by his PCP.  Patient voiced understanding and agreement with this plan.  Also advised that he should see  an ophthalmologist tomorrow give him referral information for Dr. George Ina.   Patient voiced understanding and agreement this plan.  Patient discharged stable condition.  Strict precautions advised discussed  ____________________________________________   FINAL CLINICAL IMPRESSION(S) / ED DIAGNOSES  Final diagnoses:  Hyperglycemia  Blurry vision, bilateral    Medications  insulin aspart (novoLOG) injection 0-15 Units (11 Units Subcutaneous Given 06/18/20 1559)  lactated ringers bolus 1,000 mL (1,000 mLs Intravenous New Bag/Given 06/18/20 1553)  tetracaine (PONTOCAINE) 0.5 % ophthalmic solution 2 drop (2 drops Both Eyes Given by Other 06/18/20 1602)  fluorescein ophthalmic strip 1 strip (1 strip Both Eyes Given by Other 06/18/20 1603)     ED Discharge Orders    None       Note:  This document was prepared using Dragon voice recognition software and may include unintentional dictation errors.   Lucrezia Starch, MD 06/18/20 [phone]

## 2020-06-18 NOTE — ED Notes (Signed)
Signature pad not working, pt verbalizes understanding of d/c instructions and when to return to ER

## 2020-06-18 NOTE — ED Triage Notes (Signed)
Pt sent from PCP from [facility] with c/o elevated BS for the past week with vision changes, states his BS has been 400-500's and feels like his vision has worsened, was seen here Friday for the same.

## 2020-09-05 ENCOUNTER — Other Ambulatory Visit: Payer: Self-pay

## 2020-09-05 ENCOUNTER — Other Ambulatory Visit
Admission: RE | Admit: 2020-09-05 | Discharge: 2020-09-05 | Disposition: A | Discharge status: Home / Self Care | Payer: Managed Care, Other (non HMO) | Source: Ambulatory Visit | Attending: Unknown Physician Specialty | Admitting: Unknown Physician Specialty

## 2020-09-05 NOTE — Patient Instructions (Addendum)
Your procedure is scheduled on: 09/11/2020 Tuesday Report to the Registration Desk on the 1st floor of the [facility]. To find out your arrival time, please call [phone] between 1PM - 3PM on: 09/10/2020  REMEMBER: Instructions that are not followed completely may result in serious medical risk, up to and including death; or upon the discretion of your surgeon and anesthesiologist your surgery may need to be rescheduled.  Do not eat food after midnight the night before surgery.  No gum chewing, lozengers or hard candies.  You may however, drink water up to 2 hours before you are scheduled to arrive for your surgery. Do not drink anything within 2 hours of your scheduled arrival time.  TAKE THESE MEDICATIONS THE MORNING OF SURGERY WITH A SIP OF WATER:   1. Tylenol as needed 2. Allupurinol 3. Albuterol inhaler 4. Levothyroxine  5. Crestor 6. Protonix   -  (take one the night before and one on the morning of surgery - helps to prevent nausea after surgery.)  Use inhalers on the day of surgery and bring to the hospital.  Do not take glipizide on day of Surgery. Last dose should be June 27.   Do not take metformin 2 days before surgery. Last dose will be June 25.  One week prior to surgery: starting today  Stop indomethacin and Anti-inflammatories (NSAIDS) such as Advil, Aleve, Ibuprofen, Motrin, Naproxen, Naprosyn and Aspirin based products such as Excedrin, Goodys Powder, BC Powder. Stop ANY OVER THE COUNTER supplements until after surgery. You may however, continue to take Tylenol if needed for pain up until the day of surgery.  No Alcohol for 24 hours before or after surgery.  No Smoking including e-cigarettes for 24 hours prior to surgery.  No chewable tobacco products for at least 6 hours prior to surgery.  No nicotine patches on the day of surgery.  Do not use any "recreational" drugs for at least a week prior to your surgery.  Please be advised that the combination of  cocaine and anesthesia may have negative outcomes, up to and including death. If you test positive for cocaine, your surgery will be cancelled.  On the morning of surgery brush your teeth with toothpaste and water, you may rinse your mouth with mouthwash if you wish. Do not swallow any toothpaste or mouthwash.  Do not wear jewelry, make-up, hairpins, clips or nail polish.  Do not wear lotions, powders, or perfumes.   Do not shave body from the neck down 48 hours prior to surgery just in case you cut yourself which could leave a site for infection.   Do not bring valuables to the hospital. [facility] is not responsible for any missing/lost belongings or valuables.   Notify your doctor if there is any change in your medical condition (cold, fever, infection).  Wear comfortable clothing (specific to your surgery type) to the hospital.  After surgery, you can help prevent lung complications by doing breathing exercises.  Take deep breaths and cough every 1-2 hours. Your doctor may order a device called an Incentive Spirometer to help you take deep breaths.  If you are being discharged the day of surgery, you will not be allowed to drive home. You will need a responsible adult (18 years or older) to drive you home and stay with you that night.   If you are taking public transportation, you will need to have a responsible adult (18 years or older) with you. Please confirm with your physician that it  is acceptable to use public transportation.   Please call the [facility] at [phone] if you have any questions about these instructions.  Surgery Visitation Policy:  Patients undergoing a surgery or procedure may have one family member or support person with them as long as that person is not COVID-19 positive or experiencing its symptoms.  That person may remain in the waiting area during the procedure.

## 2020-09-07 ENCOUNTER — Other Ambulatory Visit: Payer: 59

## 2020-09-07 ENCOUNTER — Encounter: Payer: 59 | Admitting: Physician Assistant

## 2020-09-07 VITALS — BP 140/82 | HR 73 | Temp 97.6°F | Resp 14 | Ht 74.0 in | Wt 296.0 lb

## 2020-09-07 DIAGNOSIS — G4733 Obstructive sleep apnea (adult) (pediatric): Secondary | ICD-10-CM

## 2020-09-07 DIAGNOSIS — E781 Pure hyperglyceridemia: Secondary | ICD-10-CM

## 2020-09-07 DIAGNOSIS — M109 Gout, unspecified: Secondary | ICD-10-CM

## 2020-09-07 DIAGNOSIS — R7989 Other specified abnormal findings of blood chemistry: Secondary | ICD-10-CM

## 2020-09-07 DIAGNOSIS — Z008 Encounter for other general examination: Secondary | ICD-10-CM

## 2020-09-07 DIAGNOSIS — E118 Type 2 diabetes mellitus with unspecified complications: Secondary | ICD-10-CM

## 2020-09-07 DIAGNOSIS — Z125 Encounter for screening for malignant neoplasm of prostate: Secondary | ICD-10-CM

## 2020-09-08 LAB — CBC WITH DIFFERENTIAL/PLATELET
Basophils Absolute: 0.1 10*3/uL (ref 0.0–0.2)
Basos: 1 %
EOS (ABSOLUTE): 0.3 10*3/uL (ref 0.0–0.4)
Eos: 3 %
Hematocrit: 44.6 % (ref 37.5–51.0)
Hemoglobin: 14.8 g/dL (ref 13.0–17.7)
Immature Grans (Abs): 0 10*3/uL (ref 0.0–0.1)
Immature Granulocytes: 0 %
Lymphocytes Absolute: 3 10*3/uL (ref 0.7–3.1)
Lymphs: 33 %
MCH: 28.1 pg (ref 26.6–33.0)
MCHC: 33.2 g/dL (ref 31.5–35.7)
MCV: 85 fL (ref 79–97)
Monocytes Absolute: 0.6 10*3/uL (ref 0.1–0.9)
Monocytes: 7 %
Neutrophils Absolute: 5.2 10*3/uL (ref 1.4–7.0)
Neutrophils: 56 %
Platelets: 303 10*3/uL (ref 150–450)
RBC: 5.27 x10E6/uL (ref 4.14–5.80)
RDW: 13.8 % (ref 11.6–15.4)
WBC: 9.1 10*3/uL (ref 3.4–10.8)

## 2020-09-08 LAB — COMPREHENSIVE METABOLIC PANEL
ALT: 40 IU/L (ref 0–44)
AST: 53 IU/L — ABNORMAL HIGH (ref 0–40)
Albumin/Globulin Ratio: 1.7 (ref 1.2–2.2)
Albumin: 4.5 g/dL (ref 4.0–5.0)
Alkaline Phosphatase: 192 IU/L — ABNORMAL HIGH (ref 44–121)
BUN/Creatinine Ratio: 17 (ref 9–20)
BUN: 15 mg/dL (ref 6–24)
Bilirubin Total: 0.5 mg/dL (ref 0.0–1.2)
CO2: 20 mmol/L (ref 20–29)
Calcium: 9.4 mg/dL (ref 8.7–10.2)
Chloride: 99 mmol/L (ref 96–106)
Creatinine, Ser: 0.89 mg/dL (ref 0.76–1.27)
Globulin, Total: 2.7 g/dL (ref 1.5–4.5)
Glucose: 97 mg/dL (ref 65–99)
Potassium: 4 mmol/L (ref 3.5–5.2)
Sodium: 140 mmol/L (ref 134–144)
Total Protein: 7.2 g/dL (ref 6.0–8.5)
eGFR: 104 mL/min/{1.73_m2} (ref >59)

## 2020-09-08 LAB — PSA: Prostate Specific Ag, Serum: 1 ng/mL (ref 0.0–4.0)

## 2020-09-08 LAB — URINALYSIS, ROUTINE W REFLEX MICROSCOPIC
Bilirubin, UA: NEGATIVE
Glucose, UA: NEGATIVE
Ketones, UA: NEGATIVE
Leukocytes,UA: NEGATIVE
Nitrite, UA: NEGATIVE
Protein,UA: NEGATIVE
RBC, UA: NEGATIVE
Specific Gravity, UA: 1.016 (ref 1.005–1.030)
Urobilinogen, Ur: 0.2 mg/dL (ref 0.2–1.0)
pH, UA: 5.5 (ref 5.0–7.5)

## 2020-09-08 LAB — MICROALBUMIN / CREATININE URINE RATIO
Creatinine, Urine: 82.5 mg/dL
Microalb/Creat Ratio: 4 mg/g creat (ref 0–29)
Microalbumin, Urine: 3.2 ug/mL

## 2020-09-08 LAB — TSH: TSH: 1.05 u[IU]/mL (ref 0.450–4.500)

## 2020-09-08 LAB — LIPID PANEL
Chol/HDL Ratio: 3.9 ratio (ref 0.0–5.0)
Cholesterol, Total: 133 mg/dL (ref 100–199)
HDL: 34 mg/dL — ABNORMAL LOW (ref >39)
LDL Chol Calc (NIH): 76 mg/dL (ref 0–99)
Triglycerides: 128 mg/dL (ref 0–149)
VLDL Cholesterol Cal: 23 mg/dL (ref 5–40)

## 2020-09-08 LAB — HGB A1C W/O EAG: Hgb A1c MFr Bld: 6.6 % — ABNORMAL HIGH (ref 4.8–5.6)

## 2020-09-08 LAB — URIC ACID: Uric Acid: 4.8 mg/dL (ref 3.8–8.4)

## 2020-09-11 ENCOUNTER — Encounter
Admission: RE | Disposition: A | Discharge status: Home / Self Care | Payer: Self-pay | Source: Home / Self Care | Attending: Unknown Physician Specialty

## 2020-09-11 ENCOUNTER — Ambulatory Visit: Payer: Managed Care, Other (non HMO) | Admitting: Registered Nurse

## 2020-09-11 ENCOUNTER — Ambulatory Visit
Admission: RE | Admit: 2020-09-11 | Discharge: 2020-09-11 | Disposition: A | Discharge status: Home / Self Care | Payer: Managed Care, Other (non HMO) | Attending: Unknown Physician Specialty | Admitting: Unknown Physician Specialty

## 2020-09-11 ENCOUNTER — Other Ambulatory Visit: Payer: Self-pay

## 2020-09-11 ENCOUNTER — Encounter: Payer: Self-pay | Admitting: Unknown Physician Specialty

## 2020-09-11 DIAGNOSIS — Z79890 Hormone replacement therapy (postmenopausal): Secondary | ICD-10-CM | POA: Insufficient documentation

## 2020-09-11 DIAGNOSIS — Z880 Allergy status to penicillin: Secondary | ICD-10-CM | POA: Insufficient documentation

## 2020-09-11 DIAGNOSIS — J320 Chronic maxillary sinusitis: Secondary | ICD-10-CM | POA: Insufficient documentation

## 2020-09-11 DIAGNOSIS — Z79899 Other long term (current) drug therapy: Secondary | ICD-10-CM | POA: Diagnosis not present

## 2020-09-11 DIAGNOSIS — Z7984 Long term (current) use of oral hypoglycemic drugs: Secondary | ICD-10-CM | POA: Insufficient documentation

## 2020-09-11 HISTORY — PX: MAXILLARY ANTROSTOMY: SHX2003

## 2020-09-11 HISTORY — PX: IMAGE GUIDED SINUS SURGERY: SHX6570

## 2020-09-11 LAB — GLUCOSE, CAPILLARY
Glucose-Capillary: 159 mg/dL — ABNORMAL HIGH (ref 70–99)
Glucose-Capillary: 163 mg/dL — ABNORMAL HIGH (ref 70–99)

## 2020-09-11 SURGERY — SINUS SURGERY, WITH IMAGING GUIDANCE
Anesthesia: General | Laterality: Right

## 2020-09-11 MED ORDER — FENTANYL CITRATE (PF) 100 MCG/2ML IJ SOLN
INTRAMUSCULAR | Status: AC
  Filled 2020-09-11: qty 2

## 2020-09-11 MED ORDER — SODIUM CHLORIDE 0.9 % IV SOLN: INTRAVENOUS | Status: DC

## 2020-09-11 MED ORDER — BACITRACIN ZINC 500 UNIT/GM EX OINT
TOPICAL_OINTMENT | CUTANEOUS | Status: AC
  Filled 2020-09-11: qty 28.35

## 2020-09-11 MED ORDER — ONDANSETRON HCL 4 MG/2ML IJ SOLN
INTRAMUSCULAR | Status: AC
  Filled 2020-09-11: qty 2

## 2020-09-11 MED ORDER — DEXMEDETOMIDINE (PRECEDEX) IN NS 20 MCG/5ML (4 MCG/ML) IV SYRINGE
PREFILLED_SYRINGE | INTRAVENOUS | Status: AC
  Filled 2020-09-11: qty 5

## 2020-09-11 MED ORDER — LIDOCAINE-EPINEPHRINE 1 %-1:100000 IJ SOLN
INTRAMUSCULAR | Status: AC
  Filled 2020-09-11: qty 1

## 2020-09-11 MED ORDER — OXYMETAZOLINE HCL 0.05 % NA SOLN: 6.0000 | Freq: Once | NASAL | Status: AC

## 2020-09-11 MED ORDER — 0.9 % SODIUM CHLORIDE (POUR BTL) OPTIME
TOPICAL | Status: DC | PRN
  Administered 2020-09-11: 100 mL

## 2020-09-11 MED ORDER — PROPOFOL 10 MG/ML IV BOLUS
INTRAVENOUS | Status: DC | PRN
  Administered 2020-09-11: 50 mg via INTRAVENOUS
  Administered 2020-09-11: 200 mg via INTRAVENOUS

## 2020-09-11 MED ORDER — ROCURONIUM BROMIDE 100 MG/10ML IV SOLN
INTRAVENOUS | Status: DC | PRN
  Administered 2020-09-11: 50 mg via INTRAVENOUS

## 2020-09-11 MED ORDER — OXYMETAZOLINE HCL 0.05 % NA SOLN
NASAL | Status: AC
  Filled 2020-09-11: qty 30

## 2020-09-11 MED ORDER — ROCURONIUM BROMIDE 10 MG/ML (PF) SYRINGE
PREFILLED_SYRINGE | INTRAVENOUS | Status: AC
  Filled 2020-09-11: qty 10

## 2020-09-11 MED ORDER — LIDOCAINE HCL (PF) 4 % IJ SOLN
INTRAMUSCULAR | Status: AC
  Filled 2020-09-11: qty 10

## 2020-09-11 MED ORDER — ORAL CARE MOUTH RINSE: 15.0000 mL | Freq: Once | OROMUCOSAL | Status: AC

## 2020-09-11 MED ORDER — MIDAZOLAM HCL 2 MG/2ML IJ SOLN
INTRAMUSCULAR | Status: DC | PRN
  Administered 2020-09-11: 2 mg via INTRAVENOUS

## 2020-09-11 MED ORDER — CIPROFLOXACIN-DEXAMETHASONE 0.3-0.1 % OT SUSP
OTIC | Status: DC | PRN
  Administered 2020-09-11: 6 [drp]

## 2020-09-11 MED ORDER — SUGAMMADEX SODIUM 500 MG/5ML IV SOLN
INTRAVENOUS | Status: AC
  Filled 2020-09-11: qty 5

## 2020-09-11 MED ORDER — LIDOCAINE HCL (CARDIAC) PF 100 MG/5ML IV SOSY
PREFILLED_SYRINGE | INTRAVENOUS | Status: DC | PRN
  Administered 2020-09-11: 100 mg via INTRAVENOUS

## 2020-09-11 MED ORDER — OXYCODONE-ACETAMINOPHEN 10-325 MG PO TABS: 1.0000 | ORAL_TABLET | ORAL | 0 refills | Status: DC | PRN

## 2020-09-11 MED ORDER — OXYCODONE HCL 5 MG/5ML PO SOLN: 5.0000 mg | Freq: Once | ORAL | Status: DC | PRN

## 2020-09-11 MED ORDER — LIDOCAINE HCL (PF) 2 % IJ SOLN
INTRAMUSCULAR | Status: AC
  Filled 2020-09-11: qty 5

## 2020-09-11 MED ORDER — FENTANYL CITRATE (PF) 100 MCG/2ML IJ SOLN: 25.0000 ug | INTRAMUSCULAR | Status: DC | PRN

## 2020-09-11 MED ORDER — GLYCOPYRROLATE 0.2 MG/ML IJ SOLN
INTRAMUSCULAR | Status: AC
  Filled 2020-09-11: qty 1

## 2020-09-11 MED ORDER — MIDAZOLAM HCL 2 MG/2ML IJ SOLN
INTRAMUSCULAR | Status: AC
  Filled 2020-09-11: qty 2

## 2020-09-11 MED ORDER — PHENYLEPHRINE HCL 10 % OP SOLN
OPHTHALMIC | Status: DC | PRN
  Administered 2020-09-11: 10 mL via TOPICAL

## 2020-09-11 MED ORDER — ACETAMINOPHEN 10 MG/ML IV SOLN
INTRAVENOUS | Status: AC
  Filled 2020-09-11: qty 100

## 2020-09-11 MED ORDER — DEXAMETHASONE SODIUM PHOSPHATE 10 MG/ML IJ SOLN
INTRAMUSCULAR | Status: DC | PRN
  Administered 2020-09-11: 5 mg via INTRAVENOUS

## 2020-09-11 MED ORDER — PHENYLEPHRINE HCL (PRESSORS) 10 MG/ML IV SOLN
INTRAVENOUS | Status: DC | PRN
  Administered 2020-09-11 (×3): 200 ug via INTRAVENOUS
  Administered 2020-09-11: 100 ug via INTRAVENOUS

## 2020-09-11 MED ORDER — OXYMETAZOLINE HCL 0.05 % NA SOLN
NASAL | Status: DC | PRN
  Administered 2020-09-11: 1

## 2020-09-11 MED ORDER — DEXAMETHASONE SODIUM PHOSPHATE 10 MG/ML IJ SOLN
INTRAMUSCULAR | Status: AC
  Filled 2020-09-11: qty 1

## 2020-09-11 MED ORDER — CLINDAMYCIN HCL 300 MG PO CAPS: 300.0000 mg | ORAL_CAPSULE | Freq: Three times a day (TID) | ORAL | 0 refills | Status: AC

## 2020-09-11 MED ORDER — PROPOFOL 10 MG/ML IV BOLUS
INTRAVENOUS | Status: AC
  Filled 2020-09-11: qty 20

## 2020-09-11 MED ORDER — BACITRACIN ZINC 500 UNIT/GM EX OINT
TOPICAL_OINTMENT | CUTANEOUS | Status: DC | PRN
  Administered 2020-09-11: 1 via TOPICAL

## 2020-09-11 MED ORDER — PHENYLEPHRINE HCL (PRESSORS) 10 MG/ML IV SOLN
INTRAVENOUS | Status: AC
  Filled 2020-09-11: qty 1

## 2020-09-11 MED ORDER — OXYCODONE HCL 5 MG PO TABS: 5.0000 mg | ORAL_TABLET | Freq: Once | ORAL | Status: DC | PRN

## 2020-09-11 MED ORDER — DEXMEDETOMIDINE (PRECEDEX) IN NS 20 MCG/5ML (4 MCG/ML) IV SYRINGE
PREFILLED_SYRINGE | INTRAVENOUS | Status: DC | PRN
  Administered 2020-09-11: 12 ug via INTRAVENOUS

## 2020-09-11 MED ORDER — FENTANYL CITRATE (PF) 100 MCG/2ML IJ SOLN
INTRAMUSCULAR | Status: DC | PRN
  Administered 2020-09-11: 50 ug via INTRAVENOUS
  Administered 2020-09-11: 25 ug via INTRAVENOUS

## 2020-09-11 MED ORDER — ONDANSETRON HCL 4 MG/2ML IJ SOLN
INTRAMUSCULAR | Status: DC | PRN
  Administered 2020-09-11: 4 mg via INTRAVENOUS

## 2020-09-11 MED ORDER — OXYMETAZOLINE HCL 0.05 % NA SOLN
NASAL | Status: AC
  Administered 2020-09-11: 6 via NASAL
  Filled 2020-09-11: qty 30

## 2020-09-11 MED ORDER — CHLORHEXIDINE GLUCONATE 0.12 % MT SOLN
OROMUCOSAL | Status: AC
  Administered 2020-09-11: 15 mL via OROMUCOSAL
  Filled 2020-09-11: qty 15

## 2020-09-11 MED ORDER — GLYCOPYRROLATE 0.2 MG/ML IJ SOLN
INTRAMUSCULAR | Status: DC | PRN
  Administered 2020-09-11: .2 mg via INTRAVENOUS

## 2020-09-11 MED ORDER — CHLORHEXIDINE GLUCONATE 0.12 % MT SOLN: 15.0000 mL | Freq: Once | OROMUCOSAL | Status: AC

## 2020-09-11 MED ORDER — ACETAMINOPHEN 10 MG/ML IV SOLN
INTRAVENOUS | Status: DC | PRN
Start: 2020-09-11 — End: 2020-09-12
  Administered 2020-09-11: 1000 mg via INTRAVENOUS

## 2020-09-11 MED ORDER — CIPROFLOXACIN-DEXAMETHASONE 0.3-0.1 % OT SUSP
OTIC | Status: AC
  Filled 2020-09-11: qty 7.5

## 2020-09-11 SURGICAL SUPPLY — 30 items
BATTERY INSTRU NAVIGATION (MISCELLANEOUS) ×9 IMPLANT
CNTNR SPEC 2.5X3XGRAD LEK (MISCELLANEOUS) ×2
COAG SUCT 10F 3.5MM HAND CTRL (MISCELLANEOUS) ×3 IMPLANT
CONT SPEC 4OZ STER OR WHT (MISCELLANEOUS) ×4
CONTAINER SPEC 2.5X3XGRAD LEK (MISCELLANEOUS) ×2 IMPLANT
COVER WAND RF STERILE (DRAPES) ×3 IMPLANT
CUP MEDICINE 2OZ PLAST GRAD ST (MISCELLANEOUS) ×6 IMPLANT
DEFOGGER ANTIFOG KIT (MISCELLANEOUS) ×3 IMPLANT
DRESSING NASL FOAM PST OP SINU (MISCELLANEOUS) IMPLANT
DRSG NASAL FOAM POST OP SINU (MISCELLANEOUS)
ELECT REM PT RETURN 9FT ADLT (ELECTROSURGICAL) ×3
ELECTRODE REM PT RTRN 9FT ADLT (ELECTROSURGICAL) ×1 IMPLANT
GAUZE 4X4 16PLY ~~LOC~~+RFID DBL (SPONGE) ×3 IMPLANT
GLOVE SURG ENC MOIS LTX SZ7.5 (GLOVE) ×3 IMPLANT
GOWN STRL REUS W/ TWL LRG LVL3 (GOWN DISPOSABLE) ×2 IMPLANT
GOWN STRL REUS W/TWL LRG LVL3 (GOWN DISPOSABLE) ×4
LABEL OR SOLS (LABEL) ×3 IMPLANT
MANIFOLD NEPTUNE II (INSTRUMENTS) ×3 IMPLANT
NS IRRIG 500ML POUR BTL (IV SOLUTION) ×3 IMPLANT
PACK HEAD/NECK (MISCELLANEOUS) ×3 IMPLANT
SPLINT NASAL REUTER .5MM BIVLV (MISCELLANEOUS) IMPLANT
SPONGE NEURO XRAY DETECT 1X3 (DISPOSABLE) ×3 IMPLANT
SUCTION FRAZIER HANDLE 10FR (MISCELLANEOUS) ×2
SUCTION TUBE FRAZIER 10FR DISP (MISCELLANEOUS) ×1 IMPLANT
SUT SILK 2 0 SH (SUTURE) ×3 IMPLANT
SWAB CULTURE AMIES ANAERIB BLU (MISCELLANEOUS) IMPLANT
SYR 3ML LL SCALE MARK (SYRINGE) ×3 IMPLANT
TAMPON NAS POPE NO STRING (MISCELLANEOUS) ×3 IMPLANT
TRACKER CRANIALMASK (MASK) ×3 IMPLANT
WATER STERILE IRR 1000ML POUR (IV SOLUTION) ×3 IMPLANT

## 2020-09-11 NOTE — H&P (Signed)
The patient's history has been reviewed, patient examined, no change in status, stable for surgery.  Questions were answered to the patients satisfaction.  

## 2020-09-11 NOTE — Anesthesia Procedure Notes (Signed)
Procedure Name: Intubation Date/Time: 09/11/2020 10:05 AM Performed by: Doreen Salvage, CRNA Pre-anesthesia Checklist: Patient identified, Patient being monitored, Timeout performed, Emergency Drugs available and Suction available Patient Re-evaluated:Patient Re-evaluated prior to induction Oxygen Delivery Method: Circle system utilized Preoxygenation: Pre-oxygenation with 100% oxygen Induction Type: IV induction Ventilation: Mask ventilation without difficulty Laryngoscope Size: Mac, McGraph and 4 Grade View: Grade I Tube type: Oral Tube size: 7.5 mm Number of attempts: 1 Airway Equipment and Method: Stylet Placement Confirmation: ETT inserted through vocal cords under direct vision, positive ETCO2 and breath sounds checked- equal and bilateral Secured at: 23 cm Tube secured with: Tape Dental Injury: Teeth and Oropharynx as per pre-operative assessment

## 2020-09-11 NOTE — Op Note (Signed)
09/11/2020  11:18 AM    Robert Skinner  [id number]   Pre-Op Dx: Chronic right maxillary sinusitis  Post-op Dx: SAME  Proc: #1 use of Stryker navigation system #2 revision right endoscopic maxillary antrostomy with removal of tissue #3 lysis of synechiae right inferior turbinate to septum  Surg:  Robert Skinner  Anes:  GOT  EBL: Approximately 30 cc  Comp: None  Findings: Synechiae between the inferior turbinate and the nasal septum polypoid mucosa emanating from the previous maxillary antrostomy no fungal elements noted  Procedure: Robert Skinner was identified in the holding area taken to the operating room and placed in supine position.  After general Endo tracheal anesthesia the table was turned 90 degrees.  The Stryker navigation device was applied calibrated and remained on throughout the procedure.  A topical anesthetic of phenylephrine lidocaine solution was placed on cottonoid pledgets and placed within the right nostril.  At approximately 5 minutes this was removed.  The 0 degree endoscope and the Stryker navigator were used to examine the right nostril there was a large synechiae between the inferior turbinate and the nasal septum this was gently lysed using the straight suction.  There was significant oozing from both of these sites therefore the suction cautery was used to cauterize the nasal septal side as well as the inferior turbinate side.  With the adhesion lysed the endoscope was introduced into the ostiomeatal region.  There was polypoid disease emanating from the previous maxillary sinus opening the straight and 45 degree forceps were then used to remove majority of the polyps from this region.  The Stryker navigator was used to probe this region with the antrostomy being widely patent.  There was significant inflammatory disease within this area and significant oozing around this portion of the maxillary opening.  Cottonoid pledgets were placed in addition to phenylephrine I  added oxymetazoline to these pledget for little longer hemostasis.  This pledget was left proximately 8 minutes.  Pledget was then removed the 0 degree endoscope and the Stryker navigator was used to examine this area there were several of the fragments of polypoid tissue and there was a small portion of the uncinate region which were removed and sent for specimen with the majority the polyps removed from the opening I could not see any fungal element.  Due to significant oozing I did not perform further exploration of this area.  Pledgets were replaced and left in position approximately 6 minutes.  These were then removed examination with the endoscope showed that the antrostomy was opened; there was still significant oozing.  I replaced the pledgets and plan to place Merocel sponges.  I fashioned a small Merocel sponge approximately 1.5 x 2 cm and placed a 2-0 silk suture ligature  through this sponge.  This was placed up into the ostiomeatal region and the suture was brought out of the nose anteriorly.  A standard Merocel sponge was then placed beneath this between the inferior and middle turbinate and the septum.  This was in hopes of preventing further synechiae I then filled the sponges with a solution of Ciprodex eardrop and oxymetazoline for hemostasis and topical antibiotic/steroid solution.  The suture was then affixed to the cheek using a piece of tape.  Patient was then returned to anesthesia where he was awakened in the operating type cart in stable condition.  Specimen: Right maxillary sinus content  Dispo:   Good  Plan: Discharged home follow-up 5 days for sponge removal.  Robert Skinner  09/11/2020 11:18 AM

## 2020-09-11 NOTE — Transfer of Care (Signed)
Immediate Anesthesia Transfer of Care Note  Patient: Robert Skinner  Procedure(s) Performed: Procedure(s): IMAGE GUIDED SINUS SURGERY (Right) Rt MAXILLARY ANTROSTOMY with TISSUE REMOVAL (Right)  Patient Location: PACU  Anesthesia Type:General  Level of Consciousness: sedated  Airway & Oxygen Therapy: Patient Spontanous Breathing and Patient connected to face mask oxygen  Post-op Assessment: Report given to RN and Post -op Vital signs reviewed and stable  Post vital signs: Reviewed and stable  Last Vitals:  Vitals:   09/11/20 0823 09/11/20 1123  BP: 139/88 120/80  Pulse: 93 81  Resp: 20 20  Temp: (!) 36.2 C 36.6 C  SpO2: 43% 20%    Complications: No apparent anesthesia complications

## 2020-09-11 NOTE — Discharge Instructions (Signed)

## 2020-09-11 NOTE — Anesthesia Preprocedure Evaluation (Signed)
Anesthesia Evaluation  Patient identified by MRN, date of birth, ID band Patient awake    Reviewed: Allergy & Precautions, NPO status , Patient's Chart, lab work & pertinent test results  History of Anesthesia Complications Negative for: history of anesthetic complications  Airway Mallampati: III  TM Distance: >3 FB Neck ROM: full    Dental  (+) Chipped, Poor Dentition, Missing, Caps   Pulmonary neg shortness of breath, asthma , sleep apnea ,    Pulmonary exam normal        Cardiovascular Exercise Tolerance: Good hypertension, (-) angina(-) Past MI and (-) DOE Normal cardiovascular exam     Neuro/Psych negative neurological ROS  negative psych ROS   GI/Hepatic Neg liver ROS, GERD  Medicated and Controlled,  Endo/Other  diabetes, Type 2, Oral Hypoglycemic AgentsHypothyroidism   Renal/GU      Musculoskeletal  (+) Arthritis ,   Abdominal   Peds  Hematology negative hematology ROS (+)   Anesthesia Other Findings Past Medical History: No date: Asthma No date: Diabetes mellitus without complication (HCC) No date: GERD (gastroesophageal reflux disease) No date: Gout No date: Hypertension No date: Hypertriglyceridemia No date: Hypothyroidism No date: Sleep apnea  Past Surgical History: 2002: FRACTURE SURGERY; Right     Comment:  screw in right foot No date: SINUS EXPLORATION 5681: UMBILICAL HERNIA REPAIR     Comment:  Dr. Rochel Brome  BMI    Body Mass Index: 38.92 kg/m      Reproductive/Obstetrics negative OB ROS                             Anesthesia Physical Anesthesia Plan  ASA: 3  Anesthesia Plan: General ETT   Post-op Pain Management:    Induction: Intravenous  PONV Risk Score and Plan: Ondansetron, Dexamethasone, Midazolam and Treatment may vary due to age or medical condition  Airway Management Planned: Oral ETT  Additional Equipment:   Intra-op Plan:    Post-operative Plan: Extubation in OR  Informed Consent: I have reviewed the patients History and Physical, chart, labs and discussed the procedure including the risks, benefits and alternatives for the proposed anesthesia with the patient or authorized representative who has indicated his/her understanding and acceptance.     Dental Advisory Given  Plan Discussed with: Anesthesiologist, CRNA and Surgeon  Anesthesia Plan Comments: (Patient consented for risks of anesthesia including but not limited to:  - adverse reactions to medications - damage to eyes, teeth, lips or other oral mucosa - nerve damage due to positioning  - sore throat or hoarseness - Damage to heart, brain, nerves, lungs, other parts of body or loss of life  Patient voiced understanding.)        Anesthesia Quick Evaluation

## 2020-09-12 ENCOUNTER — Encounter: Payer: Self-pay | Admitting: Unknown Physician Specialty

## 2020-09-12 LAB — SURGICAL PATHOLOGY

## 2020-09-12 NOTE — Anesthesia Postprocedure Evaluation (Signed)
Anesthesia Post Note  Patient: Robert Skinner  Procedure(s) Performed: IMAGE GUIDED SINUS SURGERY (Right) Rt MAXILLARY ANTROSTOMY with TISSUE REMOVAL (Right)  Patient location during evaluation: PACU Anesthesia Type: General Level of consciousness: awake and alert Pain management: pain level controlled Vital Signs Assessment: post-procedure vital signs reviewed and stable Respiratory status: spontaneous breathing, nonlabored ventilation, respiratory function stable and patient connected to nasal cannula oxygen Cardiovascular status: blood pressure returned to baseline and stable Postop Assessment: no apparent nausea or vomiting Anesthetic complications: no   No notable events documented.   Last Vitals:  Vitals:   09/11/20 1200 09/11/20 1217  BP: 118/84 128/84  Pulse: 78 73  Resp: 19 16  Temp: 36.7 C (!) 36 C  SpO2: 95% 96%    Last Pain:  Vitals:   09/11/20 1217  TempSrc: Temporal  PainSc: 0-No pain                 Precious Haws Jaquia Benedicto

## 2021-02-26 ENCOUNTER — Other Ambulatory Visit: Payer: Self-pay | Admitting: Internal Medicine

## 2021-02-26 DIAGNOSIS — E118 Type 2 diabetes mellitus with unspecified complications: Secondary | ICD-10-CM

## 2021-02-26 DIAGNOSIS — R748 Abnormal levels of other serum enzymes: Secondary | ICD-10-CM

## 2021-03-04 ENCOUNTER — Other Ambulatory Visit: Payer: Self-pay

## 2021-03-04 ENCOUNTER — Ambulatory Visit (INDEPENDENT_AMBULATORY_CARE_PROVIDER_SITE_OTHER): Payer: Managed Care, Other (non HMO) | Admitting: Dermatology

## 2021-03-04 DIAGNOSIS — L578 Other skin changes due to chronic exposure to nonionizing radiation: Secondary | ICD-10-CM

## 2021-03-04 DIAGNOSIS — L814 Other melanin hyperpigmentation: Secondary | ICD-10-CM

## 2021-03-04 DIAGNOSIS — D1800 Hemangioma unspecified site: Secondary | ICD-10-CM

## 2021-03-04 DIAGNOSIS — L738 Other specified follicular disorders: Secondary | ICD-10-CM

## 2021-03-04 DIAGNOSIS — L821 Other seborrheic keratosis: Secondary | ICD-10-CM

## 2021-03-04 DIAGNOSIS — Z1283 Encounter for screening for malignant neoplasm of skin: Secondary | ICD-10-CM

## 2021-03-04 DIAGNOSIS — D229 Melanocytic nevi, unspecified: Secondary | ICD-10-CM | POA: Diagnosis not present

## 2021-03-04 DIAGNOSIS — B353 Tinea pedis: Secondary | ICD-10-CM

## 2021-03-04 DIAGNOSIS — D485 Neoplasm of uncertain behavior of skin: Secondary | ICD-10-CM

## 2021-03-04 DIAGNOSIS — D492 Neoplasm of unspecified behavior of bone, soft tissue, and skin: Secondary | ICD-10-CM

## 2021-03-04 MED ORDER — KETOCONAZOLE 2 % EX CREA: 1.0000 "application " | TOPICAL_CREAM | Freq: Every day | CUTANEOUS | 6 refills | Status: AC

## 2021-03-04 NOTE — Progress Notes (Signed)
New Patient Visit  Subjective  Robert Skinner is a 50 y.o. male who presents for the following: Total body skin exam (Some moles on face tender, ). The patient presents for Total-Body Skin Exam (TBSE) for skin cancer screening and mole check.  The patient has spots, moles and lesions to be evaluated, some may be new or changing and the patient has concerns that these could be cancer.   The following portions of the chart were reviewed this encounter and updated as appropriate:   Tobacco   Allergies   Meds   Problems   Med Hx   Surg Hx   Fam Hx      Review of Systems:  No other skin or systemic complaints except as noted in HPI or Assessment and Plan.  Objective  Well appearing patient in no apparent distress; mood and affect are within normal limits.  A full examination was performed including scalp, head, eyes, ears, nose, lips, neck, chest, axillae, abdomen, back, buttocks, bilateral upper extremities, bilateral lower extremities, hands, feet, fingers, toes, fingernails, and toenails. All findings within normal limits unless otherwise noted below.  face Yellow lobulated paps  L ant waistline 0.5cm pap     bil feet Scaling bil feet    Assessment & Plan  Sebaceous hyperplasia face Benign Discussed treatment options Isotretinoin vs PDT vs ED vs observe  Discussed cosmetic procedure, noncovered.  $60 for 1st lesion and $15 for each additional lesion if done on the same day.  Maximum charge $350.  One touch-up treatment included no charge. Discussed risks of treatment including dyspigmentation, small scar, and/or recurrence. Recommend daily broad spectrum sunscreen SPF 30+/photoprotection to treated areas once healed.   ED x 2, L medial cheek, L forehead  Destruction of lesion - face Complexity: simple   Destruction method comment:  Electrodesiccation Informed consent: discussed and consent obtained   Timeout:  patient name, date of birth, surgical site, and procedure  verified Patient was prepped and draped in usual sterile fashion: patient was prepped with isopropyl alcohol. Outcome: patient tolerated procedure well with no complications   Additional details:  ED x 2  Neoplasm of skin L ant waistline Epidermal / dermal shaving  Lesion diameter (cm):  0.5 Informed consent: discussed and consent obtained   Timeout: patient name, date of birth, surgical site, and procedure verified   Procedure prep:  Patient was prepped and draped in usual sterile fashion Prep type:  Isopropyl alcohol Anesthesia: the lesion was anesthetized in a standard fashion   Anesthetic:  1% lidocaine w/ epinephrine 1-100,000 buffered w/ 8.4% NaHCO3 Instrument used: flexible razor blade   Hemostasis achieved with: pressure, aluminum chloride and electrodesiccation   Outcome: patient tolerated procedure well   Post-procedure details: sterile dressing applied and wound care instructions given   Dressing type: bandage and petrolatum    Specimen 1 - Surgical pathology Differential Diagnosis: D48.5 Irritated Hemangioma vs other Check Margins: No 0.5cm pap  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 Qhs to feet and in between toes  Skin cancer screening  Lentigines - Scattered tan macules - Due to sun exposure - Benign-appearing, observe - Recommend daily broad spectrum sunscreen SPF 30+ to sun-exposed areas, reapply every 2 hours as needed. - Call for any changes  Seborrheic Keratoses - Stuck-on, waxy, tan-brown papules and/or plaques  - Benign-appearing - Discussed benign  etiology and prognosis. - Observe - Call for any changes  Melanocytic Nevi - Tan-brown and/or pink-flesh-colored symmetric macules and papules - Benign appearing on exam today - Observation - Call clinic for new or changing moles - Recommend daily use of  broad spectrum spf 30+ sunscreen to sun-exposed areas.   Hemangiomas - Red papules - Discussed benign nature - Observe - Call for any changes  Actinic Damage - Chronic condition, secondary to cumulative UV/sun exposure - diffuse scaly erythematous macules with underlying dyspigmentation - Recommend daily broad spectrum sunscreen SPF 30+ to sun-exposed areas, reapply every 2 hours as needed.  - Staying in the shade or wearing long sleeves, sun glasses (UVA+UVB protection) and wide brim hats (4-inch brim around the entire circumference of the hat) are also recommended for sun protection.  - Call for new or changing lesions.  Skin cancer screening performed today.  Return if symptoms worsen or fail to improve.  I, [name], RMA, am acting as scribe for [name], MD . Documentation: I have reviewed the above documentation for accuracy and completeness, and I agree with the above.  [name], MD

## 2021-03-04 NOTE — Patient Instructions (Addendum)
If You Need Anything After Your Visit ° °If you have any questions or concerns for your doctor, please call our main line at [phone] and press option 4 to reach your doctor's medical assistant. If no one answers, please leave a voicemail as directed and we will return your call as soon as possible. Messages left after 4 pm will be answered the following business day.  ° °You may also send us a message via MyChart. We typically respond to MyChart messages within 1-2 business days. ° °For prescription refills, please ask your pharmacy to contact our office. Our fax number is [phone]. ° °If you have an urgent issue when the clinic is closed that cannot wait until the next business day, you can page your doctor at the number below.   ° °Please note that while we do our best to be available for urgent issues outside of office hours, we are not available 24/7.  ° °If you have an urgent issue and are unable to reach us, you may choose to seek medical care at your doctor's office, retail clinic, urgent care center, or emergency room. ° °If you have a medical emergency, please immediately call 911 or go to the emergency department. ° °Pager Numbers ° °- Dr. Kowalski: [phone] ° °- Dr. Moye: [phone] ° °- Dr. Stewart: [phone] ° °In the event of inclement weather, please call our main line at [phone] for an update on the status of any delays or closures. ° °Dermatology Medication Tips: °Please keep the boxes that topical medications come in in order to help keep track of the instructions about where and how to use these. Pharmacies typically print the medication instructions only on the boxes and not directly on the medication tubes.  ° °If your medication is too expensive, please contact our office at [phone] option 4 or send us a message through MyChart.  ° °We are unable to tell what your co-pay for medications will be in advance as this is different depending on your insurance coverage.  However, we may be able to find a substitute medication at lower cost or fill out paperwork to get insurance to cover a needed medication.  ° °If a prior authorization is required to get your medication covered by your insurance company, please allow us 1-2 business days to complete this process. ° °Drug prices often vary depending on where the prescription is filled and some pharmacies may offer cheaper prices. ° °The website www.goodrx.com contains coupons for medications through different pharmacies. The prices here do not account for what the cost may be with help from insurance (it may be cheaper with your insurance), but the website can give you the price if you did not use any insurance.  °- You can print the associated coupon and take it with your prescription to the pharmacy.  °- You may also stop by our office during regular business hours and pick up a GoodRx coupon card.  °- If you need your prescription sent electronically to a different pharmacy, notify our office through [facility] MyChart or by phone at [phone] option 4. ° ° ° ° °Si Usted Necesita Algo Después de Su Visita ° °También puede enviarnos un mensaje a través de MyChart. Por lo general respondemos a los mensajes de MyChart en el transcurso de 1 a 2 días hábiles. ° °Para renovar recetas, por favor pida a su farmacia que se ponga en contacto con nuestra oficina. Nuestro número de fax es el [phone]. ° °Si tiene   un asunto urgente cuando la clnica est cerrada y que no puede esperar hasta el siguiente da hbil, puede llamar/localizar a su doctor(a) al nmero que aparece a continuacin.   Por favor, tenga en cuenta que aunque hacemos todo lo posible para estar disponibles para asuntos urgentes fuera del horario de [place], no estamos disponibles las 24 horas del da, los 7 das de la [place].   Si tiene un problema urgente y no puede comunicarse con nosotros, puede optar por buscar atencin mdica  en el consultorio de su  doctor(a), en una clnica privada, en un centro de atencin urgente o en una sala de emergencias.  Si tiene Engineering geologist, por favor llame inmediatamente al 911 o vaya a la sala de emergencias.  Nmeros de bper  - Dr. Nehemiah Massed: [phone]  - Dra. Moye: [phone]  - Dra. Nicole Kindred: [phone]  En caso de inclemencias del [place], por favor llame a Johnsie Kindred principal al [phone] para una actualizacin sobre el [place] de cualquier retraso o cierre.  Consejos para la medicacin en dermatologa: Por favor, guarde las cajas en las que vienen los medicamentos de uso tpico para ayudarle a seguir las instrucciones sobre dnde y cmo usarlos. Las farmacias generalmente imprimen las instrucciones del medicamento slo en las cajas y no directamente en los tubos del [place].   Si su medicamento es muy caro, por favor, pngase en contacto con Zigmund Daniel llamando al [phone] y presione la opcin 4 o envenos un mensaje a travs de Pharmacist, community.   No podemos decirle cul ser su copago por los medicamentos por adelantado ya que esto es diferente dependiendo de la cobertura de su seguro. Sin embargo, es posible que podamos encontrar un medicamento sustituto a Electrical engineer un formulario para que el seguro cubra el medicamento que se considera necesario.   Si se requiere una autorizacin previa para que su compaa de seguros [place] su medicamento, por favor permtanos de 1 a 2 das hbiles para completar este proceso.  Los precios de los medicamentos varan con frecuencia dependiendo del Environmental consultant de dnde se surte la receta y alguna farmacias pueden ofrecer precios ms baratos.  El sitio web www.goodrx.com tiene cupones para medicamentos de Airline pilot. Los precios aqu no tienen en cuenta lo que podra costar con la ayuda del seguro (puede ser ms barato con su seguro), pero el sitio web puede darle el precio si no utiliz Research scientist (physical sciences).  - Puede imprimir el cupn  correspondiente y llevarlo con su receta a la farmacia.  - Tambin puede pasar por nuestra oficina durante el horario de atencin regular y Charity fundraiser una tarjeta de cupones de GoodRx.   Wound Care Instructions  Cleanse wound gently with soap and water once a day then pat dry with clean gauze. Apply a thing coat of Petrolatum (petroleum jelly, "Vaseline") over the wound (unless you have an allergy to this). We recommend that you use a new, sterile tube of Vaseline. Do not pick or remove scabs. Do not remove the yellow or white "healing tissue" from the base of the wound.  Cover the wound with fresh, clean, nonstick gauze and secure with paper tape. You may use Band-Aids in place of gauze and tape if the would is small enough, but would recommend trimming much of the tape off as there is often too much. Sometimes Band-Aids can irritate the skin.  You should call the office for your biopsy report after 1 week if you have not already been contacted.  If you experience any problems, such as abnormal amounts of bleeding, swelling, significant bruising, significant pain, or evidence of infection, please call the office immediately.  FOR ADULT SURGERY PATIENTS: If you need something for pain relief you may take 1 extra strength Tylenol (acetaminophen) AND 2 Ibuprofen (200mg  each) together every 4 hours as needed for pain. (do not take these if you are allergic to them or if you have a reason you should not take them.) Typically, you may only need pain medication for 1 to 3 days.    - Si necesita que su receta se enve electrnicamente a una farmacia diferente, informe a nuestra oficina a travs de MyChart de [place] o por telfono llamando al [phone] y presione la opcin 4.

## 2021-03-05 ENCOUNTER — Ambulatory Visit
Admission: RE | Admit: 2021-03-05 | Discharge: 2021-03-05 | Disposition: A | Discharge status: Home / Self Care | Payer: Managed Care, Other (non HMO) | Source: Ambulatory Visit | Attending: Internal Medicine | Admitting: Internal Medicine

## 2021-03-05 DIAGNOSIS — E118 Type 2 diabetes mellitus with unspecified complications: Secondary | ICD-10-CM | POA: Insufficient documentation

## 2021-03-05 DIAGNOSIS — R748 Abnormal levels of other serum enzymes: Secondary | ICD-10-CM | POA: Diagnosis not present

## 2021-03-06 ENCOUNTER — Telehealth: Payer: Self-pay

## 2021-03-06 NOTE — Telephone Encounter (Signed)
-----   Message from Ralene Bathe, MD sent at 03/05/2021  7:09 PM EST ----- Diagnosis Skin , left ant waistline HEMANGIOMA, BASE INVOLVED  Benign hemangioma = collection of blood vessels As suspected No further treatment needed

## 2021-03-06 NOTE — Telephone Encounter (Signed)
Unable to leave a message since patient's voicemail box is full.

## 2021-03-07 ENCOUNTER — Encounter: Payer: Self-pay | Admitting: Dermatology

## 2021-03-12 ENCOUNTER — Encounter: Payer: Self-pay | Admitting: Internal Medicine

## 2021-03-13 ENCOUNTER — Ambulatory Visit: Payer: Managed Care, Other (non HMO) | Admitting: Anesthesiology

## 2021-03-13 ENCOUNTER — Encounter
Admission: RE | Disposition: A | Discharge status: Home / Self Care | Payer: Self-pay | Source: Ambulatory Visit | Attending: Internal Medicine

## 2021-03-13 ENCOUNTER — Ambulatory Visit
Admission: RE | Admit: 2021-03-13 | Discharge: 2021-03-13 | Disposition: A | Discharge status: Home / Self Care | Payer: Managed Care, Other (non HMO) | Source: Ambulatory Visit | Attending: Internal Medicine | Admitting: Internal Medicine

## 2021-03-13 ENCOUNTER — Encounter: Payer: Self-pay | Admitting: Internal Medicine

## 2021-03-13 DIAGNOSIS — I10 Essential (primary) hypertension: Secondary | ICD-10-CM | POA: Insufficient documentation

## 2021-03-13 DIAGNOSIS — M109 Gout, unspecified: Secondary | ICD-10-CM | POA: Insufficient documentation

## 2021-03-13 DIAGNOSIS — Z8371 Family history of colonic polyps: Secondary | ICD-10-CM | POA: Insufficient documentation

## 2021-03-13 DIAGNOSIS — K640 First degree hemorrhoids: Secondary | ICD-10-CM | POA: Insufficient documentation

## 2021-03-13 DIAGNOSIS — D123 Benign neoplasm of transverse colon: Secondary | ICD-10-CM | POA: Diagnosis not present

## 2021-03-13 DIAGNOSIS — E039 Hypothyroidism, unspecified: Secondary | ICD-10-CM | POA: Insufficient documentation

## 2021-03-13 DIAGNOSIS — J45909 Unspecified asthma, uncomplicated: Secondary | ICD-10-CM | POA: Diagnosis not present

## 2021-03-13 DIAGNOSIS — Z6839 Body mass index (BMI) 39.0-39.9, adult: Secondary | ICD-10-CM | POA: Diagnosis not present

## 2021-03-13 DIAGNOSIS — Z1211 Encounter for screening for malignant neoplasm of colon: Secondary | ICD-10-CM | POA: Insufficient documentation

## 2021-03-13 DIAGNOSIS — K219 Gastro-esophageal reflux disease without esophagitis: Secondary | ICD-10-CM | POA: Insufficient documentation

## 2021-03-13 DIAGNOSIS — E119 Type 2 diabetes mellitus without complications: Secondary | ICD-10-CM | POA: Diagnosis not present

## 2021-03-13 DIAGNOSIS — G4730 Sleep apnea, unspecified: Secondary | ICD-10-CM | POA: Insufficient documentation

## 2021-03-13 DIAGNOSIS — D125 Benign neoplasm of sigmoid colon: Secondary | ICD-10-CM | POA: Diagnosis not present

## 2021-03-13 DIAGNOSIS — Z79899 Other long term (current) drug therapy: Secondary | ICD-10-CM | POA: Insufficient documentation

## 2021-03-13 DIAGNOSIS — E781 Pure hyperglyceridemia: Secondary | ICD-10-CM | POA: Diagnosis not present

## 2021-03-13 HISTORY — PX: COLONOSCOPY: SHX5424

## 2021-03-13 HISTORY — DX: Unspecified osteoarthritis, unspecified site: M19.90

## 2021-03-13 HISTORY — DX: Obesity, class 3: E66.813

## 2021-03-13 SURGERY — COLONOSCOPY
Anesthesia: General

## 2021-03-13 MED ORDER — PROPOFOL 500 MG/50ML IV EMUL
INTRAVENOUS | Status: DC | PRN
  Administered 2021-03-13: 160 ug/kg/min via INTRAVENOUS

## 2021-03-13 MED ORDER — LIDOCAINE HCL (CARDIAC) PF 100 MG/5ML IV SOSY
PREFILLED_SYRINGE | INTRAVENOUS | Status: DC | PRN
  Administered 2021-03-13: 100 mg via INTRAVENOUS

## 2021-03-13 MED ORDER — DEXMEDETOMIDINE (PRECEDEX) IN NS 20 MCG/5ML (4 MCG/ML) IV SYRINGE
PREFILLED_SYRINGE | INTRAVENOUS | Status: DC | PRN
  Administered 2021-03-13: 12 ug via INTRAVENOUS

## 2021-03-13 MED ORDER — PROPOFOL 10 MG/ML IV BOLUS
INTRAVENOUS | Status: DC | PRN
  Administered 2021-03-13: 100 mg via INTRAVENOUS

## 2021-03-13 MED ORDER — GLYCOPYRROLATE 0.2 MG/ML IJ SOLN
INTRAMUSCULAR | Status: DC | PRN
  Administered 2021-03-13: .2 mg via INTRAVENOUS

## 2021-03-13 MED ORDER — SODIUM CHLORIDE 0.9 % IV SOLN
INTRAVENOUS | Status: DC
  Administered 2021-03-13: 09:00:00 20 mL/h via INTRAVENOUS

## 2021-03-13 MED ORDER — STERILE WATER FOR IRRIGATION IR SOLN
Status: DC | PRN
  Administered 2021-03-13 (×3): 60 mL

## 2021-03-13 NOTE — Op Note (Signed)
[facility] Gastroenterology Patient Name: Lord Lancour Procedure Date: 03/13/2021 9:39 AM MRN: [id number] Account #: [id number] Date of Birth: [birth year] Admit Type: Outpatient Age: 50 Room: [facility] ENDO ROOM 2 Gender: Male Note Status: Finalized Instrument Name: Jasper Riling [id number] Procedure:             Colonoscopy Indications:           Colon cancer screening in patient at increased risk:                         Family history of 1st-degree relative with colon polyps Providers:             Benay Pike. [name] MD, MD Referring MD:          Ramonita Lab, MD (Referring MD) Medicines:             Propofol per Anesthesia Complications:         No immediate complications. Procedure:             Pre-Anesthesia Assessment:                        - The risks and benefits of the procedure and the                         sedation options and risks were discussed with the                         patient. All questions were answered and informed                         consent was obtained.                        - Patient identification and proposed procedure were                         verified prior to the procedure by the nurse. The                         procedure was verified in the procedure room.                        - ASA Grade Assessment: III - A patient with severe                         systemic disease.                        - After reviewing the risks and benefits, the patient                         was deemed in satisfactory condition to undergo the                         procedure.                        After obtaining informed consent, the colonoscope was  passed under direct vision. Throughout the procedure,                         the patient's blood pressure, pulse, and oxygen                         saturations were monitored continuously. The                         Colonoscope was introduced through the anus and                          advanced to the the cecum, identified by appendiceal                         orifice and ileocecal valve. The colonoscopy was                         performed without difficulty. The patient tolerated                         the procedure well. The quality of the bowel                         preparation was adequate. The ileocecal valve,                         appendiceal orifice, and rectum were photographed. Findings:      The perianal and digital rectal examinations were normal. Pertinent       negatives include normal sphincter tone and no palpable rectal lesions.      Non-bleeding internal hemorrhoids were found during retroflexion. The       hemorrhoids were Grade I (internal hemorrhoids that do not prolapse).      A 5 mm polyp was found in the hepatic flexure. The polyp was sessile.       The polyp was removed with a jumbo cold forceps. Resection and retrieval       were complete.      A 7 mm polyp was found in the hepatic flexure. The polyp was       semi-pedunculated. The polyp was removed with a cold snare. Resection       and retrieval were complete.      A 8 mm polyp was found in the splenic flexure. The polyp was       semi-pedunculated. The polyp was removed with a cold snare. Resection       and retrieval were complete.      Two sessile polyps were found in the sigmoid colon. The polyps were 5 to       6 mm in size. These polyps were removed with a cold snare. Resection and       retrieval were complete.      A 12 mm polyp was found in the sigmoid colon. The polyp was       pedunculated. The polyp was removed with a hot snare. Resection and       retrieval were complete.      The exam was otherwise without abnormality. Impression:            - Non-bleeding internal hemorrhoids.                        -  One 5 mm polyp at the hepatic flexure, removed with                         a jumbo cold forceps. Resected and retrieved.                        - One 7 mm  polyp at the hepatic flexure, removed with                         a cold snare. Resected and retrieved.                        - One 8 mm polyp at the splenic flexure, removed with                         a cold snare. Resected and retrieved.                        - Two 5 to 6 mm polyps in the sigmoid colon, removed                         with a cold snare. Resected and retrieved.                        - One 12 mm polyp in the sigmoid colon, removed with a                         hot snare. Resected and retrieved.                        - The examination was otherwise normal. Recommendation:        - Patient has a contact number available for                         emergencies. The signs and symptoms of potential                         delayed complications were discussed with the patient.                         Return to normal activities tomorrow. Written                         discharge instructions were provided to the patient.                        - Resume previous diet.                        - Continue present medications.                        - Repeat colonoscopy is recommended for surveillance.                         The colonoscopy date will be determined after  pathology results from today's exam become available                         for review.                        - Return to GI office PRN.                        - The findings and recommendations were discussed with                         the patient. Procedure Code(s):     --- Professional ---                        [phone], Colonoscopy, flexible; with removal of                         tumor(s), polyp(s), or other lesion(s) by snare                         technique                        45380, 109, Colonoscopy, flexible; with biopsy, single                         or multiple Diagnosis Code(s):     --- Professional ---                        K64.0, First degree hemorrhoids                         Z83.71, Family history of colonic polyps                        K63.5, Polyp of colon CPT copyright 2019 American Medical Association. All rights reserved. The codes documented in this report are preliminary and upon coder review may  be revised to meet current compliance requirements. Efrain Sella MD, MD 03/13/2021 10:14:27 AM This report has been signed electronically. Number of Addenda: 0 Note Initiated On: 03/13/2021 9:39 AM Scope Withdrawal Time: 0 hours 18 minutes 6 seconds  Total Procedure Duration: 0 hours 20 minutes 8 seconds  Estimated Blood Loss:  Estimated blood loss: none.      [facility]

## 2021-03-13 NOTE — Transfer of Care (Signed)
Immediate Anesthesia Transfer of Care Note  Patient: Robert Skinner  Procedure(s) Performed: COLONOSCOPY  Patient Location: PACU and Endoscopy Unit  Anesthesia Type:General  Level of Consciousness: drowsy  Airway & Oxygen Therapy: Patient Spontanous Breathing and Patient connected to nasal cannula oxygen  Post-op Assessment: Report given to RN  Post vital signs: stable  Last Vitals:  Vitals Value Taken Time  BP 113/76 03/13/21 1012  Temp    Pulse 92 03/13/21 1012  Resp 17 03/13/21 1012  SpO2 98 % 03/13/21 1012    Last Pain:  Vitals:   03/13/21 0841  TempSrc: Temporal  PainSc: 0-No pain         Complications: No notable events documented.

## 2021-03-13 NOTE — Anesthesia Postprocedure Evaluation (Signed)
Anesthesia Post Note  Patient: ACEN CRAUN  Procedure(s) Performed: COLONOSCOPY  Patient location during evaluation: Phase II Anesthesia Type: General Level of consciousness: awake and alert, awake and oriented Pain management: pain level controlled Vital Signs Assessment: post-procedure vital signs reviewed and stable Respiratory status: spontaneous breathing, nonlabored ventilation and respiratory function stable Cardiovascular status: blood pressure returned to baseline and stable Postop Assessment: no apparent nausea or vomiting Anesthetic complications: no   No notable events documented.   Last Vitals:  Vitals:   03/13/21 1022 03/13/21 1032  BP: 128/87 139/88  Pulse: 88 93  Resp: 18 20  Temp:    SpO2: 93% 96%    Last Pain:  Vitals:   03/13/21 1032  TempSrc:   PainSc: 0-No pain                 Phill Mutter

## 2021-03-13 NOTE — Interval H&P Note (Signed)
History and Physical Interval Note:  03/13/2021 9:42 AM  Robert Skinner  has presented today for surgery, with the diagnosis of Colon cancer screening (Z12.11) Family history of polyps in the colon (Z83.71).  The various methods of treatment have been discussed with the patient and family. After consideration of risks, benefits and other options for treatment, the patient has consented to  Procedure(s) with comments: COLONOSCOPY (N/A) - DM as a surgical intervention.  The patient's history has been reviewed, patient examined, no change in status, stable for surgery.  I have reviewed the patient's chart and labs.  Questions were answered to the patient's satisfaction.     [place], [place]

## 2021-03-13 NOTE — Anesthesia Preprocedure Evaluation (Signed)
Anesthesia Evaluation    Airway Mallampati: III  TM Distance: >3 FB Neck ROM: Full    Dental no notable dental hx.    Pulmonary asthma , sleep apnea and Continuous Positive Airway Pressure Ventilation ,    Pulmonary exam normal        Cardiovascular hypertension, Pt. on medications Normal cardiovascular exam     Neuro/Psych    GI/Hepatic Bowel prep,GERD  Medicated,  Endo/Other  diabetesHypothyroidism   Renal/GU      Musculoskeletal   Abdominal   Peds  Hematology   Anesthesia Other Findings Arthritis    Asthma    Diabetes mellitus without complication (HCC)  GERD (gastroesophageal reflux disease)   Gout    Hypertension    Hypertriglyceridemia    Hypothyroidism    Obesity, Class III, BMI 40-49.9 (morbid obesity) ([place])    Sleep apnea       Reproductive/Obstetrics                             Anesthesia Physical Anesthesia Plan  ASA: 3  Anesthesia Plan: General   Post-op Pain Management:    Induction: Inhalational  PONV Risk Score and Plan: 2 and TIVA and Propofol infusion  Airway Management Planned: Natural Airway and Nasal Cannula  Additional Equipment:   Intra-op Plan:   Post-operative Plan:   Informed Consent: I have reviewed the patients History and Physical, chart, labs and discussed the procedure including the risks, benefits and alternatives for the proposed anesthesia with the patient or authorized representative who has indicated his/her understanding and acceptance.       Plan Discussed with: CRNA, Anesthesiologist and Surgeon  Anesthesia Plan Comments:         Anesthesia Quick Evaluation

## 2021-03-13 NOTE — H&P (Signed)
Outpatient short stay form Pre-procedure 03/13/2021 9:41 AM Robert Skinner K. Robert Skinner, M.D.  Primary Physician: Robert Skinner, M.D.  Reason for visit:  Family history of colon polyps  History of present illness:   50 year old patient presenting for family history of colon polyps. Patient denies any change in bowel habits, rectal bleeding or involuntary weight loss.     Current Facility-Administered Medications:    0.9 %  sodium chloride infusion, , Intravenous, Continuous, [place], Robert Pike, MD, Last Rate: 20 mL/hr at 03/13/21 7829, Continued from Pre-op at 03/13/21 5621  Medications Prior to Admission  Medication Sig Dispense Refill Last Dose   acetaminophen (TYLENOL) 500 MG tablet Take 500-1,000 mg by mouth every 6 (six) hours as needed (pain).   Past Week   albuterol (VENTOLIN HFA) 108 (90 Base) MCG/ACT inhaler Inhale 1-2 puffs into the lungs every 4 (four) hours as needed for wheezing or shortness of breath.   Past Week   allopurinol (ZYLOPRIM) 100 MG tablet Take 100 mg by mouth daily.   Past Week   allopurinol (ZYLOPRIM) 300 MG tablet Take 300 mg by mouth daily.   Past Week   azelastine (ASTELIN) 0.1 % nasal spray Place 1 spray into both nostrils 2 (two) times daily. Use in each nostril as directed   Past Week   colchicine 0.6 MG tablet Take 0.6 mg by mouth daily as needed (gout).   Past Week   cyclobenzaprine (FLEXERIL) 10 MG tablet Take 10 mg by mouth daily.   Past Week   fexofenadine (ALLEGRA) 180 MG tablet Take 180 mg by mouth daily.   Past Week   fluticasone (FLONASE) 50 MCG/ACT nasal spray Place 2 sprays into both nostrils daily. (Patient taking differently: Place 2 sprays into both nostrils daily as needed for allergies.) 16 g 12 Past Week   glipiZIDE (GLUCOTROL XL) 10 MG 24 hr tablet Take 20 mg by mouth daily with breakfast.   Past Week   indomethacin (INDOCIN) 50 MG capsule Take 50 mg by mouth daily as needed (gout).   Past Week   ketoconazole (NIZORAL) 2 % cream Apply 1  application topically at bedtime. Qhs to feet and in between toes 60 g 6 Past Week   levothyroxine (SYNTHROID) 150 MCG tablet Take 150 mcg by mouth daily before breakfast.   Past Week   loratadine (CLARITIN) 10 MG tablet Take 10 mg by mouth daily.   Past Week   losartan (COZAAR) 50 MG tablet Take 50 mg by mouth daily.   03/12/2021   metFORMIN (GLUCOPHAGE-XR) 500 MG 24 hr tablet Take 1,000 mg by mouth at bedtime.   Past Week   montelukast (SINGULAIR) 10 MG tablet Take 10 mg by mouth at bedtime.   Past Week   oxyCODONE-acetaminophen (PERCOCET) 10-325 MG tablet Take 1 tablet by mouth every 4 (four) hours as needed for pain. 40 tablet 0 Past Week   pantoprazole (PROTONIX) 40 MG tablet Take 40 mg by mouth daily.   03/12/2021   rosuvastatin (CRESTOR) 20 MG tablet Take 20 mg by mouth daily.   Past Week   Semaglutide,0.25 or 0.5MG /DOS, (OZEMPIC, 0.25 OR 0.5 MG/DOSE,) 2 MG/1.5ML SOPN Inject into the skin.   Past Week     Allergies  Allergen Reactions   Probenecid Other (See Comments)    Seizures, welts   Moxifloxacin Other (See Comments)    Unsure of reaction type   Penicillins Rash    Ancef 2 gms given 05/16/2000 without S/Sxs of reaction.  Past Medical History:  Diagnosis Date   Arthritis    Asthma    Diabetes mellitus without complication (HCC)    GERD (gastroesophageal reflux disease)    Gout    Hypertension    Hypertriglyceridemia    Hypothyroidism    Obesity, Class Skinner, BMI 40-49.9 (morbid obesity) ([place])    Sleep apnea     Review of systems:  Otherwise negative.    Physical Exam  Gen: Alert, oriented. Appears stated age.  HEENT: /AT. PERRLA. Lungs: CTA, no wheezes. CV: RR nl S1, S2. Abd: soft, benign, no masses. BS+ Ext: No edema. Pulses 2+    Planned procedures: Proceed with colonoscopy. The patient understands the nature of the planned procedure, indications, risks, alternatives and potential complications including but not limited to bleeding, infection,  perforation, damage to internal organs and possible oversedation/side effects from anesthesia. The patient agrees and gives consent to proceed.  Please refer to procedure notes for findings, recommendations and patient disposition/instructions.     Robert Skinner K. Robert Skinner, M.D. Gastroenterology 03/13/2021  9:41 AM

## 2021-03-14 ENCOUNTER — Encounter: Payer: Self-pay | Admitting: Internal Medicine

## 2021-03-14 LAB — SURGICAL PATHOLOGY

## 2021-03-21 ENCOUNTER — Telehealth: Payer: Self-pay

## 2021-03-21 NOTE — Telephone Encounter (Signed)
Advised patient of results/hd  

## 2021-03-21 NOTE — Telephone Encounter (Signed)
-----   Message from Ralene Bathe, MD sent at 03/05/2021  7:09 PM EST ----- Diagnosis Skin , left ant waistline HEMANGIOMA, BASE INVOLVED  Benign hemangioma = collection of blood vessels As suspected No further treatment needed

## 2021-07-11 ENCOUNTER — Ambulatory Visit: Payer: Managed Care, Other (non HMO) | Admitting: Dermatology

## 2021-08-22 ENCOUNTER — Encounter: Payer: Self-pay | Admitting: Podiatry

## 2021-08-22 ENCOUNTER — Other Ambulatory Visit: Payer: Self-pay | Admitting: Podiatry

## 2021-08-28 ENCOUNTER — Ambulatory Visit
Admission: RE | Admit: 2021-08-28 | Discharge: 2021-08-28 | Disposition: A | Discharge status: Home / Self Care | Payer: Managed Care, Other (non HMO) | Source: Ambulatory Visit | Attending: Podiatry | Admitting: Podiatry

## 2021-08-28 ENCOUNTER — Ambulatory Visit: Payer: Managed Care, Other (non HMO) | Admitting: Anesthesiology

## 2021-08-28 ENCOUNTER — Other Ambulatory Visit: Payer: Self-pay

## 2021-08-28 ENCOUNTER — Encounter: Payer: Self-pay | Admitting: Podiatry

## 2021-08-28 ENCOUNTER — Ambulatory Visit: Payer: Self-pay

## 2021-08-28 ENCOUNTER — Encounter
Admission: RE | Disposition: A | Discharge status: Home / Self Care | Payer: Self-pay | Source: Ambulatory Visit | Attending: Podiatry

## 2021-08-28 DIAGNOSIS — E119 Type 2 diabetes mellitus without complications: Secondary | ICD-10-CM | POA: Diagnosis not present

## 2021-08-28 DIAGNOSIS — J45909 Unspecified asthma, uncomplicated: Secondary | ICD-10-CM | POA: Diagnosis not present

## 2021-08-28 DIAGNOSIS — E039 Hypothyroidism, unspecified: Secondary | ICD-10-CM | POA: Insufficient documentation

## 2021-08-28 DIAGNOSIS — Z6839 Body mass index (BMI) 39.0-39.9, adult: Secondary | ICD-10-CM | POA: Insufficient documentation

## 2021-08-28 DIAGNOSIS — E669 Obesity, unspecified: Secondary | ICD-10-CM | POA: Diagnosis not present

## 2021-08-28 DIAGNOSIS — I10 Essential (primary) hypertension: Secondary | ICD-10-CM | POA: Diagnosis not present

## 2021-08-28 DIAGNOSIS — X58XXXD Exposure to other specified factors, subsequent encounter: Secondary | ICD-10-CM | POA: Insufficient documentation

## 2021-08-28 DIAGNOSIS — S92352K Displaced fracture of fifth metatarsal bone, left foot, subsequent encounter for fracture with nonunion: Secondary | ICD-10-CM | POA: Insufficient documentation

## 2021-08-28 DIAGNOSIS — K219 Gastro-esophageal reflux disease without esophagitis: Secondary | ICD-10-CM | POA: Insufficient documentation

## 2021-08-28 DIAGNOSIS — G4730 Sleep apnea, unspecified: Secondary | ICD-10-CM | POA: Insufficient documentation

## 2021-08-28 HISTORY — DX: Motion sickness, initial encounter: T75.3XXA

## 2021-08-28 HISTORY — PX: ORIF TOE FRACTURE: SHX5032

## 2021-08-28 LAB — GLUCOSE, CAPILLARY
Glucose-Capillary: 109 mg/dL — ABNORMAL HIGH (ref 70–99)
Glucose-Capillary: 115 mg/dL — ABNORMAL HIGH (ref 70–99)

## 2021-08-28 SURGERY — OPEN REDUCTION INTERNAL FIXATION (ORIF) METATARSAL (TOE) FRACTURE
Anesthesia: General | Site: Foot | Laterality: Left

## 2021-08-28 MED ORDER — DEXTROSE 5 % IV SOLN
2000.0000 mg | Freq: Once | INTRAVENOUS | Status: AC
  Administered 2021-08-28: 2000 mg via INTRAVENOUS

## 2021-08-28 MED ORDER — METOCLOPRAMIDE HCL 5 MG PO TABS: 5.0000 mg | ORAL_TABLET | Freq: Three times a day (TID) | ORAL | Status: DC | PRN

## 2021-08-28 MED ORDER — OXYCODONE-ACETAMINOPHEN 5-325 MG PO TABS: 1.0000 | ORAL_TABLET | Freq: Four times a day (QID) | ORAL | 0 refills | Status: AC | PRN

## 2021-08-28 MED ORDER — ONDANSETRON HCL 4 MG/2ML IJ SOLN
INTRAMUSCULAR | Status: DC | PRN
  Administered 2021-08-28: 4 mg via INTRAVENOUS

## 2021-08-28 MED ORDER — CEFAZOLIN SODIUM-DEXTROSE 1-4 GM/50ML-% IV SOLN
INTRAVENOUS | Status: DC | PRN
  Administered 2021-08-28: 1 g via INTRAVENOUS

## 2021-08-28 MED ORDER — LACTATED RINGERS IV SOLN: INTRAVENOUS | Status: DC

## 2021-08-28 MED ORDER — PHENYLEPHRINE HCL (PRESSORS) 10 MG/ML IV SOLN
INTRAVENOUS | Status: DC | PRN
  Administered 2021-08-28: 100 ug via INTRAVENOUS
  Administered 2021-08-28: 200 ug via INTRAVENOUS
  Administered 2021-08-28: 100 ug via INTRAVENOUS

## 2021-08-28 MED ORDER — FENTANYL CITRATE (PF) 100 MCG/2ML IJ SOLN
INTRAMUSCULAR | Status: DC | PRN
  Administered 2021-08-28 (×2): 50 ug via INTRAVENOUS

## 2021-08-28 MED ORDER — PROPOFOL 10 MG/ML IV BOLUS
INTRAVENOUS | Status: DC | PRN
  Administered 2021-08-28: 200 mg via INTRAVENOUS
  Administered 2021-08-28: 50 mg via INTRAVENOUS

## 2021-08-28 MED ORDER — METOCLOPRAMIDE HCL 5 MG/ML IJ SOLN: 5.0000 mg | Freq: Three times a day (TID) | INTRAMUSCULAR | Status: DC | PRN

## 2021-08-28 MED ORDER — ONDANSETRON HCL 4 MG PO TABS: 4.0000 mg | ORAL_TABLET | Freq: Four times a day (QID) | ORAL | Status: DC | PRN

## 2021-08-28 MED ORDER — DEXMEDETOMIDINE (PRECEDEX) IN NS 20 MCG/5ML (4 MCG/ML) IV SYRINGE
PREFILLED_SYRINGE | INTRAVENOUS | Status: DC | PRN
  Administered 2021-08-28: 10 ug via INTRAVENOUS

## 2021-08-28 MED ORDER — VANCOMYCIN HCL 1500 MG/300ML IV SOLN: 1500.0000 mg | INTRAVENOUS | Status: DC

## 2021-08-28 MED ORDER — FENTANYL CITRATE PF 50 MCG/ML IJ SOSY: 25.0000 ug | PREFILLED_SYRINGE | INTRAMUSCULAR | Status: DC | PRN

## 2021-08-28 MED ORDER — LIDOCAINE HCL (CARDIAC) PF 100 MG/5ML IV SOSY
PREFILLED_SYRINGE | INTRAVENOUS | Status: DC | PRN
  Administered 2021-08-28: 50 mg via INTRATRACHEAL

## 2021-08-28 MED ORDER — GLYCOPYRROLATE 0.2 MG/ML IJ SOLN
INTRAMUSCULAR | Status: DC | PRN
  Administered 2021-08-28: .1 mg via INTRAVENOUS

## 2021-08-28 MED ORDER — BUPIVACAINE HCL (PF) 0.25 % IJ SOLN
INTRAMUSCULAR | Status: DC | PRN
  Administered 2021-08-28 (×2): 10 mL

## 2021-08-28 MED ORDER — EPHEDRINE SULFATE (PRESSORS) 50 MG/ML IJ SOLN
INTRAMUSCULAR | Status: DC | PRN
  Administered 2021-08-28: 10 mg via INTRAVENOUS
  Administered 2021-08-28: 15 mg via INTRAVENOUS

## 2021-08-28 MED ORDER — ONDANSETRON HCL 4 MG/2ML IJ SOLN
4.0000 mg | Freq: Once | INTRAMUSCULAR | Status: DC | PRN
Start: 2021-08-28 — End: 2021-08-28

## 2021-08-28 MED ORDER — ONDANSETRON HCL 4 MG/2ML IJ SOLN: 4.0000 mg | Freq: Four times a day (QID) | INTRAMUSCULAR | Status: DC | PRN

## 2021-08-28 MED ORDER — MIDAZOLAM HCL 5 MG/5ML IJ SOLN
INTRAMUSCULAR | Status: DC | PRN
  Administered 2021-08-28: 2 mg via INTRAVENOUS

## 2021-08-28 MED ORDER — BUPIVACAINE LIPOSOME 1.3 % IJ SUSP
INTRAMUSCULAR | Status: DC | PRN
  Administered 2021-08-28: 20 mL

## 2021-08-28 SURGICAL SUPPLY — 61 items
BIT DRILL 1.3 (BIT) ×2
BIT DRILL 100X1.3XAO CNCT (BIT) IMPLANT
BIT DRL 100X1.3XAO CNCT (BIT) ×1
BNDG CMPR 75X41 PLY HI ABS (GAUZE/BANDAGES/DRESSINGS) ×1
BNDG COHESIVE 4X5 TAN ST LF (GAUZE/BANDAGES/DRESSINGS) ×1 IMPLANT
BNDG ELASTIC 4X5.8 VLCR STR LF (GAUZE/BANDAGES/DRESSINGS) ×1 IMPLANT
BNDG ESMARK 4X12 TAN STRL LF (GAUZE/BANDAGES/DRESSINGS) ×2 IMPLANT
BNDG GZE 12X3 1 PLY HI ABS (GAUZE/BANDAGES/DRESSINGS) ×1
BNDG STRETCH 4X75 STRL LF (GAUZE/BANDAGES/DRESSINGS) ×2 IMPLANT
BNDG STRETCH GAUZE 3IN X12FT (GAUZE/BANDAGES/DRESSINGS) ×1 IMPLANT
CANISTER SUCT 1200ML W/VALVE (MISCELLANEOUS) ×2 IMPLANT
DRAPE FLUOR MINI C-ARM 54X84 (DRAPES) ×2 IMPLANT
ELECT REM PT RETURN 9FT ADLT (ELECTROSURGICAL) ×2
ELECTRODE REM PT RTRN 9FT ADLT (ELECTROSURGICAL) ×1 IMPLANT
GAUZE SPONGE 4X4 12PLY STRL (GAUZE/BANDAGES/DRESSINGS) ×2 IMPLANT
GAUZE XEROFORM 1X8 LF (GAUZE/BANDAGES/DRESSINGS) ×2 IMPLANT
GLOVE INDICATOR 8.0 STRL GRN (GLOVE) ×3 IMPLANT
GLOVE SURG ENC MOIS LTX SZ7.5 (GLOVE) ×3 IMPLANT
GOWN STRL REUS TWL 2XL XL LVL4 (GOWN DISPOSABLE) ×1 IMPLANT
GOWN STRL REUS W/ TWL LRG LVL3 (GOWN DISPOSABLE) ×2 IMPLANT
GOWN STRL REUS W/TWL LRG LVL3 (GOWN DISPOSABLE) ×2
K-WIRE DBL END TROCAR 6X.045 (WIRE)
K-WIRE DBL END TROCAR 6X.062 (WIRE)
K-WIRE SMOOTH 1.6X150MM (WIRE) ×2
KIT TURNOVER KIT A (KITS) ×2 IMPLANT
KWIRE DBL END TROCAR 6X.045 (WIRE) IMPLANT
KWIRE DBL END TROCAR 6X.062 (WIRE) IMPLANT
KWIRE SMOOTH 1.6X150MM (WIRE) IMPLANT
NDL HYPO 21X1.5 SAFETY (NEEDLE) IMPLANT
NEEDLE HYPO 21X1.5 SAFETY (NEEDLE) ×2 IMPLANT
NS IRRIG 500ML POUR BTL (IV SOLUTION) ×2 IMPLANT
PACK EXTREMITY (MISCELLANEOUS) ×1 IMPLANT
PLATE 6H LOCK (Plate) IMPLANT
PLATE 6HOLE LOCK (Plate) ×2 IMPLANT
PLATE JMF HOOK COMP 6H LEFT (Plate) ×1 IMPLANT
PLATE SCREW NLOCK 2.5X11 (Screw) ×2 IMPLANT
SCREW LOCKING 2.5X12 (Screw) ×2 IMPLANT
SCREW LOCKING 2.5X14 (Screw) ×3 IMPLANT
SCREW LOCKING 2.5X16 (Screw) ×1 IMPLANT
SCREW LOCKING 2.5X8 (Screw) ×1 IMPLANT
STIMULATOR BONE (ORTHOPEDIC SUPPLIES) ×1
STIMULATOR BONE GROWTH EMG EXT (ORTHOPEDIC SUPPLIES) IMPLANT
STOCKINETTE IMPERVIOUS LG (DRAPES) ×2 IMPLANT
STRIP CLOSURE SKIN 1/4X4 (GAUZE/BANDAGES/DRESSINGS) ×2 IMPLANT
SUT ETHILON 3-0 FS-10 30 BLK (SUTURE) ×2
SUT ETHILON 4-0 (SUTURE)
SUT ETHILON 4-0 FS2 18XMFL BLK (SUTURE)
SUT ETHILON 5-0 FS-2 18 BLK (SUTURE) IMPLANT
SUT MNCRL 4-0 (SUTURE) ×2
SUT MNCRL 4-0 27XMFL (SUTURE) ×1
SUT MNCRL+ 5-0 UNDYED PC-3 (SUTURE) IMPLANT
SUT MONOCRYL 5-0 (SUTURE)
SUT VIC AB 3-0 SH 27 (SUTURE) ×2
SUT VIC AB 3-0 SH 27X BRD (SUTURE) IMPLANT
SUT VIC AB 4-0 FS2 27 (SUTURE) ×1 IMPLANT
SUT VIC AB 4-0 SH 27 (SUTURE) ×2
SUT VIC AB 4-0 SH 27XANBCTRL (SUTURE) IMPLANT
SUTURE EHLN 3-0 FS-10 30 BLK (SUTURE) IMPLANT
SUTURE ETHLN 4-0 FS2 18XMF BLK (SUTURE) IMPLANT
SUTURE MNCRL 4-0 27XMF (SUTURE) IMPLANT
WIRE OLIVE SMOOTH 1.3 (WIRE) ×1 IMPLANT

## 2021-08-28 NOTE — Transfer of Care (Signed)
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 (Left: Foot)  Patient Location: PACU  Anesthesia Type: General  Level of Consciousness: awake, alert  and patient cooperative  Airway and Oxygen Therapy: Patient Spontanous Breathing and Patient connected to supplemental oxygen  Post-op Assessment: Post-op Vital signs reviewed, Patient's Cardiovascular Status Stable, Respiratory Function Stable, Patent Airway and No signs of Nausea or vomiting  Post-op Vital Signs: Reviewed and stable  Complications: No notable events documented.

## 2021-08-28 NOTE — Op Note (Signed)
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  Open reduction with internal fixation Jones fracture nonunion left foot 2.  Procurement of calcaneal bone graft lateral calcaneus 3.  Application of external bone stimulator left foot 4.  Intraoperative fluoroscopy use left foot    EBL: Minimal    Anesthesia:local and general.  Local consisted of a total of 20 cc of 0.25% bupivacaine and 20 cc of Exparel long-acting anesthetic    Hemostasis: Mid calf tourniquet inflated to 250 mmHg for approximately 110 minutes    Specimen: None    Complications: Fracture of screw placed into locking plate    Operative indications:Robert Skinner is an 51 y.o. that presents today for surgical intervention.  The risks/benefits/alternatives/complications have been discussed and consent has been given.    Procedure:  Patient was brought into the OR and placed on the operating table in thesupine position. After anesthesia was obtained theleft lower extremity was prepped and draped in usual sterile fashion.  The Jones fracture site was mapped out with use of fluoroscopy preoperatively.  It showed the obvious nonunion site.  Attention was directed to the dorsal aspect of the left fifth metatarsal where a longitudinal incision was performed along the lateral fifth metatarsal region.  This was taken from about the midshaft to the fifth metatarsal base area.  Sharp and blunt dissection carried down to the periosteum.  Care was taken to retract and sural nerve which was encountered at the most proximal aspect of the incision.  At this time subperiosteal dissection was performed.  Large amount of fibrotic tissue was noted overlying the nonunion site.  The nonunion was then debrided with a curette and rongeur.  This was taken down to good healthy bleeding bone at this time.  Gapping was noted at this  time.  Felt that calcaneal bone graft could be used to fill the gap at this time.  Tension was then directed to the lateral wall of the calcaneus.  A small 1 cm incision was made.  Blunt dissection carried down to the calcaneus.  Using the [employer] calcaneal cancellous bone harvesting tool I was able to remove a small amount of cancellous bone graft.  This was used later for the nonunion site.  Attention was then redirected to the nonunion site.  The site was then prepped with a 1.5 mm drill bit.  The calcaneal bone graft was then placed into the nonunion site.  Time a Jones fracture a hook plate was placed on the lateral aspect of the fifth metatarsal.  This was from the [employer] set.  The hook was embedded into the base of the fifth metatarsal and impacted into good position.  The Jones fracture was then manually compressed.  A locking screw was placed in the fifth metatarsal base proximal to the fracture site.  Next a nonlocking compression screw 2.5 mm was placed into the plate for compression.  Good alignment and stability was noted.  At this time the proximal and distal aspect of the Jones fracture site was filled with locking screws.  These were 2.5 mm in nature.  With use of fluoroscopy the fracture site was evaluated and unfortunately just distal to the fracture site one of the locking screws broke at the run out.  The head of the screw was removed from the plate but the shaft of the  screw was left within the body of the metatarsal.  I felt further stability was needed given the fact that a nonlocking and locking screws were the only screws holding the distal Jones fracture site.  At this time a 6 hole plate was placed in the plantar aspect of the fifth metatarsal.  2 screws proximal and 2 screws distally were filled with locking 2.5 millimeter screws.  A central screw hole was left open for the fracture site.  Excellent stability was noted with this final fixation.  The superficial wound was then flushed  with copious amounts of irrigation.  Closure was then performed with a 3-0 Vicryl the deeper tissue, 4-0 Vicryl in the subcutaneous tissue and a 4-0 Monocryl for the skin.  The small stab incision on the calcaneus was closed with a 3-0 nylon.  A dressing was applied to the foot and foot was placed in an equalizer walker boot.  At this time an external bone stimulator was placed overlying the Jones fracture site.    Patient tolerated the procedure and anesthesia well.  Was transported from the OR to the PACU with all vital signs stable and vascular status intact. To be discharged per routine protocol.  Will follow up in approximately 1 week in the outpatient clinic.

## 2021-08-28 NOTE — Anesthesia Postprocedure Evaluation (Signed)
Anesthesia Post Note  Patient: Robert Skinner  Procedure(s) Performed: Closed fracture of base of fifth metatarsal bone of left foot at metapyseal-diaphyseal junction with nonunion. (Left: Foot)     Patient location during evaluation: PACU Anesthesia Type: General Level of consciousness: awake and alert Pain management: pain level controlled Vital Signs Assessment: post-procedure vital signs reviewed and stable Respiratory status: nonlabored ventilation and spontaneous breathing Cardiovascular status: blood pressure returned to baseline Postop Assessment: no apparent nausea or vomiting Anesthetic complications: no   No notable events documented.  Rashida Ladouceur [employer]

## 2021-08-28 NOTE — Anesthesia Procedure Notes (Signed)
Procedure Name: General with mask airway Date/Time: 08/28/2021 1:15 PM  Performed by: Jeannene Patella, CRNAPre-anesthesia Checklist: Emergency Drugs available, Suction available, Patient identified, Patient being monitored and Timeout performed Patient Re-evaluated:Patient Re-evaluated prior to induction Oxygen Delivery Method: Circle system utilized Preoxygenation: Pre-oxygenation with 100% oxygen Induction Type: IV induction Ventilation: Mask ventilation without difficulty LMA: LMA inserted LMA Size: 5.0 Number of attempts: 1 Placement Confirmation: ETT inserted through vocal cords under direct vision, positive ETCO2 and breath sounds checked- equal and bilateral Tube secured with: Tape Dental Injury: Teeth and Oropharynx as per pre-operative assessment

## 2021-08-28 NOTE — Anesthesia Preprocedure Evaluation (Signed)
Anesthesia Evaluation  Patient identified by MRN, date of birth, ID band Patient awake    Reviewed: Allergy & Precautions, NPO status , Patient's Chart, lab work & pertinent test results  Airway Mallampati: III  TM Distance: >3 FB Neck ROM: Full    Dental no notable dental hx.    Pulmonary asthma , sleep apnea ,    Pulmonary exam normal        Cardiovascular hypertension, Normal cardiovascular exam     Neuro/Psych negative neurological ROS  negative psych ROS   GI/Hepatic GERD  Controlled,  Endo/Other  diabetes, Type 2Hypothyroidism BMI 39  Renal/GU      Musculoskeletal fracture of base of fifth metatarsal bone of left foot    Abdominal (+) + obese,   Peds  Hematology negative hematology ROS (+)   Anesthesia Other Findings   Reproductive/Obstetrics                             Anesthesia Physical Anesthesia Plan  ASA: 3  Anesthesia Plan: General   Post-op Pain Management: Fentanyl IV   Induction: Intravenous  PONV Risk Score and Plan: 2 and Midazolam, Treatment may vary due to age or medical condition, Ondansetron and Dexamethasone  Airway Management Planned: LMA  Additional Equipment:   Intra-op Plan:   Post-operative Plan: Extubation in OR  Informed Consent: I have reviewed the patients History and Physical, chart, labs and discussed the procedure including the risks, benefits and alternatives for the proposed anesthesia with the patient or authorized representative who has indicated his/her understanding and acceptance.     Dental advisory given  Plan Discussed with: CRNA  Anesthesia Plan Comments:         Anesthesia Quick Evaluation

## 2021-08-28 NOTE — Discharge Instructions (Signed)
[facility]  POST OPERATIVE INSTRUCTIONS FOR DR. FOWLER AND [facility] P[facility]   Take your medication as prescribed.  Pain medication should be taken only as needed.  Keep the dressing clean, dry and intact.  Keep your foot elevated above the heart level for the first 48 hours.  Walking to the bathroom and brief periods of walking are acceptable, unless we have instructed you to be non-weight bearing.  Always wear your post-op shoe when walking.  Always use your crutches if you are to be non-weight bearing.  Do not take a shower. Baths are permissible as long as the foot is kept out of the water.   Every hour you are awake:  Bend your knee 15 times. Flex foot 15 times Massage calf 15 times  Call [facility] ([phone]) if any of the following problems occur: You develop a temperature or fever. The bandage becomes saturated with blood. Medication does not stop your pain. Injury of the foot occurs. Any symptoms of infection including redness, odor, or red streaks running from wound. 

## 2021-08-28 NOTE — H&P (Signed)
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 H88.875 - Left foot pain.  The various methods of treatment have been discussed with the patient.  No guarantees were given.  After consideration of risks, benefits and other options for treatment, the patient has consented to surgery.  I have reviewed the patients' chart and labs.     A history and physical examination was performed in my office.  The patient was reexamined.  There have been no changes to this history and physical examination.  Robert Skinner A

## 2021-08-29 ENCOUNTER — Encounter: Payer: Self-pay | Admitting: Podiatry

## 2021-09-05 ENCOUNTER — Encounter: Payer: Self-pay | Admitting: Podiatry

## 2022-02-26 ENCOUNTER — Ambulatory Visit: Payer: Managed Care, Other (non HMO) | Admitting: Dermatology

## 2022-02-26 ENCOUNTER — Encounter: Payer: Self-pay | Admitting: Dermatology

## 2022-02-26 VITALS — BP 136/84 | HR 61

## 2022-02-26 DIAGNOSIS — L918 Other hypertrophic disorders of the skin: Secondary | ICD-10-CM | POA: Diagnosis not present

## 2022-02-26 DIAGNOSIS — L821 Other seborrheic keratosis: Secondary | ICD-10-CM

## 2022-02-26 DIAGNOSIS — B078 Other viral warts: Secondary | ICD-10-CM

## 2022-02-26 DIAGNOSIS — L738 Other specified follicular disorders: Secondary | ICD-10-CM | POA: Diagnosis not present

## 2022-02-26 DIAGNOSIS — L820 Inflamed seborrheic keratosis: Secondary | ICD-10-CM

## 2022-02-26 DIAGNOSIS — D492 Neoplasm of unspecified behavior of bone, soft tissue, and skin: Secondary | ICD-10-CM

## 2022-02-26 NOTE — Progress Notes (Signed)
Follow-Up Visit   Subjective  Robert Skinner is a 51 y.o. male who presents for the following: lesions (Areas to be checked. Larger at times. Raised. Left upper eyelid, neck). The patient has spots, moles and lesions to be evaluated, some may be new or changing and the patient has concerns that these could be cancer. Wife with patient.   The following portions of the chart were reviewed this encounter and updated as appropriate:  Tobacco  Allergies  Meds  Problems  Med Hx  Surg Hx  Fam Hx     Review of Systems: No other skin or systemic complaints except as noted in HPI or Assessment and Plan.  Objective  Well appearing patient in no apparent distress; mood and affect are within normal limits.  A focused examination was performed including face, neck, ears. Relevant physical exam findings are noted in the Assessment and Plan.  Left Upper Eyelid 0.3cm flesh papule     Right Malar Cheek, left lateral calf Erythematous keratotic or waxy stuck-on papule or plaque.   Assessment & Plan   Neoplasm of skin Left Upper Eyelid  Skin / nail biopsy Type of biopsy: tangential   Informed consent: discussed and consent obtained   Timeout: patient name, date of birth, surgical site, and procedure verified   Procedure prep:  Patient was prepped and draped in usual sterile fashion Prep type:  Isopropyl alcohol Anesthesia: the lesion was anesthetized in a standard fashion   Anesthetic:  1% lidocaine w/ epinephrine 1-100,000 buffered w/ 8.4% NaHCO3 Instrument used: scissors   Hemostasis achieved with: pressure, aluminum chloride and electrodesiccation   Outcome: patient tolerated procedure well   Post-procedure details: sterile dressing applied and wound care instructions given   Dressing type: bandage and petrolatum    Specimen 1 - Surgical pathology Differential Diagnosis: ISK vs cyst vs other Check Margins: No VERY small specimen in bottle  Inflamed seborrheic keratosis  (2) left lateral calf; Right Malar Cheek With associated sebaceous hyperplasia at right malar cheek  Symptomatic, irritating, patient would like treated.  Destruction of lesion - left lateral calf Complexity: simple   Destruction method: cryotherapy   Informed consent: discussed and consent obtained   Timeout:  patient name, date of birth, surgical site, and procedure verified Lesion destroyed using liquid nitrogen: Yes   Region frozen until ice ball extended beyond lesion: Yes   Outcome: patient tolerated procedure well with no complications   Post-procedure details: wound care instructions given   Additional details:  Prior to procedure, discussed risks of blister formation, small wound, skin dyspigmentation, or rare scar following cryotherapy. Recommend Vaseline ointment to treated areas while healing.   Destruction of lesion - Right Malar Cheek Complexity: simple   Destruction method comment:  Electrodesiccation Informed consent: discussed and consent obtained   Timeout:  patient name, date of birth, surgical site, and procedure verified Anesthesia: the lesion was anesthetized in a standard fashion   Anesthetic:  1% lidocaine w/ epinephrine 1-100,000 local infiltration Hemostasis achieved with:  electrodesiccation Post-procedure details: wound care instructions given    Acrochordons (Skin Tags) - Fleshy, skin-colored pedunculated papules - Benign appearing.  - Observe. - If desired, they can be removed with an in office procedure that is not covered by insurance. - Please call the clinic if you notice any new or changing lesions.   Sebaceous Hyperplasia - Small yellow papules with a central dell - Benign - Observe  Seborrheic Keratoses - Stuck-on, waxy, tan-brown papules and/or plaques  -  Benign-appearing - Discussed benign etiology and prognosis. - Observe - Call for any changes  Return if symptoms worsen or fail to improve.  I, Emelia Salisbury, CMA, am acting as  scribe for [name], MD. Documentation: I have reviewed the above documentation for accuracy and completeness, and I agree with the above.  [name], MD

## 2022-02-26 NOTE — Patient Instructions (Addendum)
Wound Care Instructions  Cleanse wound gently with soap and water once a day then pat dry with clean gauze. Apply a thin coat of Petrolatum (petroleum jelly, "Vaseline") over the wound (unless you have an allergy to this). We recommend that you use a new, sterile tube of Vaseline. Do not pick or remove scabs. Do not remove the yellow or white "healing tissue" from the base of the wound.  Cover the wound with fresh, clean, nonstick gauze and secure with paper tape. You may use Band-Aids in place of gauze and tape if the wound is small enough, but would recommend trimming much of the tape off as there is often too much. Sometimes Band-Aids can irritate the skin.  You should call the office for your biopsy report after 1 week if you have not already been contacted.  If you experience any problems, such as abnormal amounts of bleeding, swelling, significant bruising, significant pain, or evidence of infection, please call the office immediately.  FOR ADULT SURGERY PATIENTS: If you need something for pain relief you may take 1 extra strength Tylenol (acetaminophen) AND 2 Ibuprofen (200mg each) together every 4 hours as needed for pain. (do not take these if you are allergic to them or if you have a reason you should not take them.) Typically, you may only need pain medication for 1 to 3 days.      Recommend daily broad spectrum sunscreen SPF 30+ to sun-exposed areas, reapply every 2 hours as needed. Call for new or changing lesions.  Staying in the shade or wearing long sleeves, sun glasses (UVA+UVB protection) and wide brim hats (4-inch brim around the entire circumference of the hat) are also recommended for sun protection.     Due to recent changes in healthcare laws, you may see results of your pathology and/or laboratory studies on MyChart before the doctors have had a chance to review them. We understand that in some cases there may be results that are confusing or concerning to you. Please  understand that not all results are received at the same time and often the doctors may need to interpret multiple results in order to provide you with the best plan of care or course of treatment. Therefore, we ask that you please give us 2 business days to thoroughly review all your results before contacting the office for clarification. Should we see a critical lab result, you will be contacted sooner.   If You Need Anything After Your Visit  If you have any questions or concerns for your doctor, please call our main line at [phone] and press option 4 to reach your doctor's medical assistant. If no one answers, please leave a voicemail as directed and we will return your call as soon as possible. Messages left after 4 pm will be answered the following business day.   You may also send us a message via MyChart. We typically respond to MyChart messages within 1-2 business days.  For prescription refills, please ask your pharmacy to contact our office. Our fax number is [phone].  If you have an urgent issue when the clinic is closed that cannot wait until the next business day, you can page your doctor at the number below.    Please note that while we do our best to be available for urgent issues outside of office hours, we are not available 24/7.   If you have an urgent issue and are unable to reach us, you may choose to seek medical care at   your doctor's office, retail clinic, urgent care center, or emergency room.  If you have a medical emergency, please immediately call 911 or go to the emergency department.  Pager Numbers  - Dr. Kowalski: [phone]  - Dr. Moye: [phone]  - Dr. Stewart: [phone]  In the event of inclement weather, please call our main line at [phone] for an update on the status of any delays or closures.  Dermatology Medication Tips: Please keep the boxes that topical medications come in in order to help keep track of the instructions about  where and how to use these. Pharmacies typically print the medication instructions only on the boxes and not directly on the medication tubes.   If your medication is too expensive, please contact our office at [phone] option 4 or send us a message through MyChart.   We are unable to tell what your co-pay for medications will be in advance as this is different depending on your insurance coverage. However, we may be able to find a substitute medication at lower cost or fill out paperwork to get insurance to cover a needed medication.   If a prior authorization is required to get your medication covered by your insurance company, please allow us 1-2 business days to complete this process.  Drug prices often vary depending on where the prescription is filled and some pharmacies may offer cheaper prices.  The website www.goodrx.com contains coupons for medications through different pharmacies. The prices here do not account for what the cost may be with help from insurance (it may be cheaper with your insurance), but the website can give you the price if you did not use any insurance.  - You can print the associated coupon and take it with your prescription to the pharmacy.  - You may also stop by our office during regular business hours and pick up a GoodRx coupon card.  - If you need your prescription sent electronically to a different pharmacy, notify our office through [facility] MyChart or by phone at [phone] option 4.     Si Usted Necesita Algo Despus de Su Visita  Tambin puede enviarnos un mensaje a travs de MyChart. Por lo general respondemos a los mensajes de MyChart en el transcurso de 1 a 2 das hbiles.  Para renovar recetas, por favor pida a su farmacia que se ponga en contacto con nuestra oficina. Nuestro nmero de fax es el [phone].  Si tiene un asunto urgente cuando la clnica est cerrada y que no puede esperar hasta el siguiente da hbil, puede  llamar/localizar a su doctor(a) al nmero que aparece a continuacin.   Por favor, tenga en cuenta que aunque hacemos todo lo posible para estar disponibles para asuntos urgentes fuera del horario de oficina, no estamos disponibles las 24 horas del da, los 7 das de la semana.   Si tiene un problema urgente y no puede comunicarse con nosotros, puede optar por buscar atencin mdica  en el consultorio de su doctor(a), en una clnica privada, en un centro de atencin urgente o en una sala de emergencias.  Si tiene una emergencia mdica, por favor llame inmediatamente al 911 o vaya a la sala de emergencias.  Nmeros de bper  - Dr. Kowalski: [phone]  - Dra. Moye: [phone]  - Dra. Stewart: [phone]  En caso de inclemencias del tiempo, por favor llame a nuestra lnea principal al [phone] para una actualizacin sobre el estado de cualquier retraso o cierre.  Consejos para la medicacin en dermatologa:   Por favor, guarde las cajas en las que vienen los medicamentos de uso tpico para ayudarle a seguir las instrucciones sobre dnde y cmo usarlos. Las farmacias generalmente imprimen las instrucciones del medicamento slo en las cajas y no directamente en los tubos del medicamento.   Si su medicamento es muy caro, por favor, pngase en contacto con nuestra oficina llamando al [phone] y presione la opcin 4 o envenos un mensaje a travs de MyChart.   No podemos decirle cul ser su copago por los medicamentos por adelantado ya que esto es diferente dependiendo de la cobertura de su seguro. Sin embargo, es posible que podamos encontrar un medicamento sustituto a menor costo o llenar un formulario para que el seguro cubra el medicamento que se considera necesario.   Si se requiere una autorizacin previa para que su compaa de seguros cubra su medicamento, por favor permtanos de 1 a 2 das hbiles para completar este proceso.  Los precios de los medicamentos varan con  frecuencia dependiendo del lugar de dnde se surte la receta y alguna farmacias pueden ofrecer precios ms baratos.  El sitio web www.goodrx.com tiene cupones para medicamentos de diferentes farmacias. Los precios aqu no tienen en cuenta lo que podra costar con la ayuda del seguro (puede ser ms barato con su seguro), pero el sitio web puede darle el precio si no utiliz ningn seguro.  - Puede imprimir el cupn correspondiente y llevarlo con su receta a la farmacia.  - Tambin puede pasar por nuestra oficina durante el horario de atencin regular y recoger una tarjeta de cupones de GoodRx.  - Si necesita que su receta se enve electrnicamente a una farmacia diferente, informe a nuestra oficina a travs de MyChart de [place] o por telfono llamando al [phone] y presione la opcin 4.  

## 2022-03-13 ENCOUNTER — Telehealth: Payer: Self-pay

## 2022-03-13 ENCOUNTER — Encounter: Payer: Self-pay | Admitting: Dermatology

## 2022-03-13 NOTE — Telephone Encounter (Signed)
Discussed pathology results. Patient voiced understanding. RTC if recurs.

## 2022-03-13 NOTE — Telephone Encounter (Signed)
-----   Message from Ralene Bathe, MD sent at 03/06/2022  4:33 PM EST ----- Diagnosis Skin , left upper eyelid CONSISTENT WITH VERRUCA VULGARIS  Benign viral wart May recur No further treatment needed unless recurs

## 2022-03-31 ENCOUNTER — Ambulatory Visit: Payer: Managed Care, Other (non HMO) | Admitting: Dermatology
